# Patient Record
Sex: Male | Born: 1980
Health system: Southern US, Community
[De-identification: ages and names within clinical notes are randomized; demographics above are authoritative.]

## PROBLEM LIST (undated history)

## (undated) DIAGNOSIS — T7840XA Allergy, unspecified, initial encounter: Secondary | ICD-10-CM

## (undated) DIAGNOSIS — J45909 Unspecified asthma, uncomplicated: Secondary | ICD-10-CM

## (undated) HISTORY — DX: Unspecified asthma, uncomplicated: J45.909

## (undated) HISTORY — DX: Allergy, unspecified, initial encounter: T78.40XA

---

## 2004-04-01 ENCOUNTER — Emergency Department (HOSPITAL_COMMUNITY): Admission: EM | Admit: 2004-04-01 | Discharge: 2004-04-01 | Payer: Self-pay | Admitting: Emergency Medicine

## 2005-05-28 ENCOUNTER — Emergency Department (HOSPITAL_COMMUNITY): Admission: EM | Admit: 2005-05-28 | Discharge: 2005-05-28 | Payer: Self-pay | Admitting: Emergency Medicine

## 2005-06-02 ENCOUNTER — Emergency Department (HOSPITAL_COMMUNITY): Admission: EM | Admit: 2005-06-02 | Discharge: 2005-06-02 | Payer: Self-pay | Admitting: Emergency Medicine

## 2007-01-09 ENCOUNTER — Encounter: Admission: EM | Admit: 2007-01-09 | Discharge: 2007-01-09 | Payer: Self-pay | Admitting: Occupational Medicine

## 2008-02-13 ENCOUNTER — Emergency Department (HOSPITAL_COMMUNITY): Admission: EM | Admit: 2008-02-13 | Discharge: 2008-02-13 | Payer: Self-pay | Admitting: Family Medicine

## 2008-04-17 ENCOUNTER — Emergency Department (HOSPITAL_COMMUNITY): Admission: EM | Admit: 2008-04-17 | Discharge: 2008-04-17 | Payer: Self-pay | Admitting: Emergency Medicine

## 2008-11-27 ENCOUNTER — Emergency Department (HOSPITAL_COMMUNITY): Admission: EM | Admit: 2008-11-27 | Discharge: 2008-11-27 | Payer: Self-pay | Admitting: Obstetrics & Gynecology

## 2009-07-21 ENCOUNTER — Emergency Department (HOSPITAL_COMMUNITY): Admission: EM | Admit: 2009-07-21 | Discharge: 2009-07-21 | Payer: Self-pay | Admitting: Emergency Medicine

## 2009-08-08 ENCOUNTER — Emergency Department (HOSPITAL_COMMUNITY): Admission: EM | Admit: 2009-08-08 | Discharge: 2009-08-08 | Payer: Self-pay | Admitting: Emergency Medicine

## 2010-07-08 ENCOUNTER — Emergency Department (HOSPITAL_COMMUNITY): Admission: EM | Admit: 2010-07-08 | Discharge: 2010-07-08 | Payer: Self-pay | Admitting: Family Medicine

## 2011-02-08 ENCOUNTER — Inpatient Hospital Stay (INDEPENDENT_AMBULATORY_CARE_PROVIDER_SITE_OTHER)
Admission: RE | Admit: 2011-02-08 | Discharge: 2011-02-08 | Disposition: A | Discharge status: Home / Self Care | Payer: 59 | Source: Ambulatory Visit | Attending: Family Medicine | Admitting: Family Medicine

## 2011-02-08 DIAGNOSIS — H109 Unspecified conjunctivitis: Secondary | ICD-10-CM

## 2011-02-16 LAB — URINALYSIS, ROUTINE W REFLEX MICROSCOPIC
Ketones, ur: NEGATIVE mg/dL
Nitrite: NEGATIVE
Protein, ur: 30 mg/dL — AB
Specific Gravity, Urine: 1.025 (ref 1.005–1.030)
Urobilinogen, UA: 1 mg/dL (ref 0.0–1.0)

## 2011-02-16 LAB — URINE CULTURE: Culture: NO GROWTH

## 2011-02-16 LAB — URINE MICROSCOPIC-ADD ON

## 2015-01-11 ENCOUNTER — Emergency Department (HOSPITAL_COMMUNITY)
Admission: EM | Admit: 2015-01-11 | Discharge: 2015-01-11 | Disposition: A | Discharge status: Home / Self Care | Payer: 59 | Attending: Emergency Medicine | Admitting: Emergency Medicine

## 2015-01-11 ENCOUNTER — Encounter (HOSPITAL_COMMUNITY): Payer: Self-pay | Admitting: Emergency Medicine

## 2015-01-11 DIAGNOSIS — Y9289 Other specified places as the place of occurrence of the external cause: Secondary | ICD-10-CM | POA: Diagnosis not present

## 2015-01-11 DIAGNOSIS — Y9389 Activity, other specified: Secondary | ICD-10-CM | POA: Insufficient documentation

## 2015-01-11 DIAGNOSIS — S29019A Strain of muscle and tendon of unspecified wall of thorax, initial encounter: Secondary | ICD-10-CM | POA: Insufficient documentation

## 2015-01-11 DIAGNOSIS — Y998 Other external cause status: Secondary | ICD-10-CM | POA: Diagnosis not present

## 2015-01-11 DIAGNOSIS — W228XXA Striking against or struck by other objects, initial encounter: Secondary | ICD-10-CM | POA: Diagnosis not present

## 2015-01-11 DIAGNOSIS — S3992XA Unspecified injury of lower back, initial encounter: Secondary | ICD-10-CM | POA: Diagnosis present

## 2015-01-11 LAB — URINALYSIS, ROUTINE W REFLEX MICROSCOPIC
Bilirubin Urine: NEGATIVE
GLUCOSE, UA: NEGATIVE mg/dL
HGB URINE DIPSTICK: NEGATIVE
Ketones, ur: NEGATIVE mg/dL
LEUKOCYTES UA: NEGATIVE
Nitrite: NEGATIVE
PH: 7 (ref 5.0–8.0)
Protein, ur: NEGATIVE mg/dL
SPECIFIC GRAVITY, URINE: 1.015 (ref 1.005–1.030)
Urobilinogen, UA: 1 mg/dL (ref 0.0–1.0)

## 2015-01-11 MED ORDER — IBUPROFEN 800 MG PO TABS: 800.0000 mg | ORAL_TABLET | Freq: Three times a day (TID) | ORAL | Status: DC | PRN

## 2015-01-11 MED ORDER — HYDROCODONE-ACETAMINOPHEN 5-325 MG PO TABS: 1.0000 | ORAL_TABLET | ORAL | Status: DC | PRN

## 2015-01-11 MED ORDER — CYCLOBENZAPRINE HCL 5 MG PO TABS: 5.0000 mg | ORAL_TABLET | Freq: Three times a day (TID) | ORAL | Status: DC | PRN

## 2015-01-11 MED ORDER — IBUPROFEN 800 MG PO TABS
800.0000 mg | ORAL_TABLET | Freq: Once | ORAL | Status: AC
  Administered 2015-01-11: 800 mg via ORAL
  Filled 2015-01-11: qty 1

## 2015-01-11 NOTE — Discharge Instructions (Signed)
Muscle Strain °A muscle strain is an injury that occurs when a muscle is stretched beyond its normal length. Usually a small number of muscle fibers are torn when this happens. Muscle strain is rated in degrees. First-degree strains have the least amount of muscle fiber tearing and pain. Second-degree and third-degree strains have increasingly more tearing and pain.  °Usually, recovery from muscle strain takes 1-2 weeks. Complete healing takes 5-6 weeks.  °CAUSES  °Muscle strain happens when a sudden, violent force placed on a muscle stretches it too far. This may occur with lifting, sports, or a fall.  °RISK FACTORS °Muscle strain is especially common in athletes.  °SIGNS AND SYMPTOMS °At the site of the muscle strain, there may be: °· Pain. °· Bruising. °· Swelling. °· Difficulty using the muscle due to pain or lack of normal function. °DIAGNOSIS  °Your health care provider will perform a physical exam and ask about your medical history. °TREATMENT  °Often, the best treatment for a muscle strain is resting, icing, and applying cold compresses to the injured area.   °HOME CARE INSTRUCTIONS  °· Use the PRICE method of treatment to promote muscle healing during the first 2-3 days after your injury. The PRICE method involves: °· Protecting the muscle from being injured again. °· Restricting your activity and resting the injured body part. °· Icing your injury. To do this, put ice in a plastic bag. Place a towel between your skin and the bag. Then, apply the ice and leave it on from 15-20 minutes each hour. After the third day, switch to moist heat packs. °· Apply compression to the injured area with a splint or elastic bandage. Be careful not to wrap it too tightly. This may interfere with blood circulation or increase swelling. °· Elevate the injured body part above the level of your heart as often as you can. °· Only take over-the-counter or prescription medicines for pain, discomfort, or fever as directed by your  health care provider. °· Warming up prior to exercise helps to prevent future muscle strains. °SEEK MEDICAL CARE IF:  °· You have increasing pain or swelling in the injured area. °· You have numbness, tingling, or a significant loss of strength in the injured area. °MAKE SURE YOU:  °· Understand these instructions. °· Will watch your condition. °· Will get help right away if you are not doing well or get worse. °Document Released: 10/29/2005 Document Revised: 08/19/2013 Document Reviewed: 05/28/2013 °ExitCare® Patient Information ©2015 [employer]. This information is not intended to replace advice given to you by your health care provider. Make sure you discuss any questions you have with your health care provider. ° ° °Heat Therapy °Heat therapy can help ease sore, stiff, injured, and tight muscles and joints. Heat relaxes your muscles, which may help ease your pain.  °RISKS AND COMPLICATIONS °If you have any of the following conditions, do not use heat therapy unless your health care provider has approved: °· Poor circulation. °· Healing wounds or scarred skin in the area being treated. °· Diabetes, heart disease, or high blood pressure. °· Not being able to feel (numbness) the area being treated. °· Unusual swelling of the area being treated. °· Active infections. °· Blood clots. °· Cancer. °· Inability to communicate pain. This may include young children and people who have problems with their brain function (dementia). °· Pregnancy. °Heat therapy should only be used on old, pre-existing, or long-lasting (chronic) injuries. Do not use heat therapy on new injuries unless directed by   your health care provider. °HOW TO USE HEAT THERAPY °There are several different kinds of heat therapy, including: °· Moist heat pack. °· Warm water bath. °· Hot water bottle. °· Electric heating pad. °· Heated gel pack. °· Heated wrap. °· Electric heating pad. °Use the heat therapy method suggested by your health care provider.  Follow your health care provider's instructions on when and how to use heat therapy. °GENERAL HEAT THERAPY RECOMMENDATIONS °· Do not sleep while using heat therapy. Only use heat therapy while you are awake. °· Your skin may turn pink while using heat therapy. Do not use heat therapy if your skin turns red. °· Do not use heat therapy if you have new pain. °· High heat or long exposure to heat can cause burns. Be careful when using heat therapy to avoid burning your skin. °· Do not use heat therapy on areas of your skin that are already irritated, such as with a rash or sunburn. °SEEK MEDICAL CARE IF: °· You have blisters, redness, swelling, or numbness. °· You have new pain. °· Your pain is worse. °MAKE SURE YOU: °· Understand these instructions. °· Will watch your condition. °· Will get help right away if you are not doing well or get worse. °Document Released: 01/21/2012 Document Revised: 03/15/2014 Document Reviewed: 12/22/2013 °ExitCare® Patient Information ©2015 [employer]. This information is not intended to replace advice given to you by your health care provider. Make sure you discuss any questions you have with your health care provider. ° °

## 2015-01-11 NOTE — ED Provider Notes (Signed)
TIME SEEN: 7:10 AM  CHIEF COMPLAINT: Left-sided back pain  HPI: Patient is a 34 year old male who presents to the emergency department left-sided back pain that started at 6:30 PM last night. He states his job is rather physical and he has to frequently lift things that are heavy but denies any known injury to the back. States pain is worse with movement. Denies any nausea, vomiting, diarrhea. No fevers or chills. No numbness, tingling or focal weakness. No bowel or bladder incontinence. No prior history of kidney stones. Denies dysuria or hematuria.  ROS: See HPI Constitutional: no fever  Eyes: no drainage  ENT: no runny nose   Cardiovascular:  no chest pain  Resp: no SOB  GI: no vomiting GU: no dysuria Integumentary: no rash  Allergy: no hives  Musculoskeletal: no leg swelling  Neurological: no slurred speech ROS otherwise negative  PAST MEDICAL HISTORY/PAST SURGICAL HISTORY:  History reviewed. No pertinent past medical history.  MEDICATIONS:  Prior to Admission medications   Not on File    ALLERGIES:  No Known Allergies  SOCIAL HISTORY:  History  Substance Use Topics  . Smoking status: Never Smoker   . Smokeless tobacco: Not on file  . Alcohol Use: No    FAMILY HISTORY: History reviewed. No pertinent family history.  EXAM: BP 145/94 mmHg  Pulse 80  Temp(Src) 98.2 F (36.8 C) (Oral)  Resp 18  SpO2 100% CONSTITUTIONAL: Alert and oriented and responds appropriately to questions. Well-appearing; well-nourished HEAD: Normocephalic EYES: Conjunctivae clear, PERRL ENT: normal nose; no rhinorrhea; moist mucous membranes; pharynx without lesions noted NECK: Supple, no meningismus, no LAD  CARD: RRR; S1 and S2 appreciated; no murmurs, no clicks, no rubs, no gallops RESP: Normal chest excursion without splinting or tachypnea; breath sounds clear and equal bilaterally; no wheezes, no rhonchi, no rales ABD/GI: Normal bowel sounds; non-distended; soft, non-tender, no  rebound, no guarding BACK:  The back appears normal and is tender to palpation over the left-sided thoracic paraspinal muscles without obvious deformity or lesions, no CVA tenderness EXT: Normal ROM in all joints; non-tender to palpation; no edema; normal capillary refill; no cyanosis    SKIN: Normal color for age and race; warm NEURO: Moves all extremities equally, strength 5/5 in all 4 extremities, sensation to light touch intact diffusely, 2+ deep tendon reflexes in bilateral upper lower extremities, normal gait PSYCH: The patient's mood and manner are appropriate. Grooming and personal hygiene are appropriate.  MEDICAL DECISION MAKING: Patient here with what appears to be musculoskeletal back pain. Urine shows no sign of infection or hematuria to suggest urinary tract infection or kidney stone. Pain is reproducible with palpation and movement. He is neurologically intact. Have given him ibuprofen in the emergency department and will discharge with ibuprofen, Flexeril, Vicodin. Will provide work note. Discussed importance of stretching, applying heat and rest. Discussed return precautions. He verbalizes understanding and is comfortable with plan.       Layla MawKristen N Ward, DO 01/11/15 [phone]

## 2015-01-11 NOTE — ED Notes (Signed)
Pt is c/o left flank pain that started last night around 1830  Denies N/V/D  States pain feels like something is hitting him in the side and that the pain is worse with movement

## 2016-03-18 ENCOUNTER — Emergency Department (HOSPITAL_COMMUNITY)
Admission: EM | Admit: 2016-03-18 | Discharge: 2016-03-19 | Disposition: A | Discharge status: Home / Self Care | Payer: 59 | Attending: Emergency Medicine | Admitting: Emergency Medicine

## 2016-03-18 ENCOUNTER — Encounter (HOSPITAL_COMMUNITY): Payer: Self-pay | Admitting: Emergency Medicine

## 2016-03-18 ENCOUNTER — Emergency Department (HOSPITAL_COMMUNITY): Payer: 59

## 2016-03-18 DIAGNOSIS — W228XXA Striking against or struck by other objects, initial encounter: Secondary | ICD-10-CM | POA: Insufficient documentation

## 2016-03-18 DIAGNOSIS — S60221A Contusion of right hand, initial encounter: Secondary | ICD-10-CM | POA: Diagnosis not present

## 2016-03-18 DIAGNOSIS — Y9389 Activity, other specified: Secondary | ICD-10-CM | POA: Insufficient documentation

## 2016-03-18 DIAGNOSIS — Y998 Other external cause status: Secondary | ICD-10-CM | POA: Insufficient documentation

## 2016-03-18 DIAGNOSIS — Y9289 Other specified places as the place of occurrence of the external cause: Secondary | ICD-10-CM | POA: Insufficient documentation

## 2016-03-18 MED ORDER — OXYCODONE-ACETAMINOPHEN 5-325 MG PO TABS
1.0000 | ORAL_TABLET | Freq: Once | ORAL | Status: AC
  Administered 2016-03-18: 1 via ORAL
  Filled 2016-03-18: qty 1

## 2016-03-18 MED ORDER — IBUPROFEN 200 MG PO TABS
600.0000 mg | ORAL_TABLET | Freq: Once | ORAL | Status: AC
  Administered 2016-03-18: 600 mg via ORAL
  Filled 2016-03-18: qty 3

## 2016-03-18 NOTE — ED Notes (Signed)
Pt c/o R hand pain, swelling noted. Pt reports punching dash 1 hour ago.

## 2016-03-18 NOTE — Discharge Instructions (Signed)
Hand Contusion  A hand contusion is a deep bruise on your hand area. Contusions are the result of an injury that caused bleeding under the skin. The contusion may turn blue, purple, or yellow. Minor injuries will give you a painless contusion, but more severe contusions may stay painful and swollen for a few weeks.  CAUSES   A contusion is usually caused by a blow, trauma, or direct force to an area of the body.  SYMPTOMS    Swelling and redness of the injured area.   Discoloration of the injured area.   Tenderness and soreness of the injured area.   Pain.  DIAGNOSIS   The diagnosis can be made by taking a history and performing a physical exam. An X-ray, CT scan, or MRI may be needed to determine if there were any associated injuries, such as broken bones (fractures).  TREATMENT   Often, the best treatment for a hand contusion is resting, elevating, icing, and applying cold compresses to the injured area. Over-the-counter medicines may also be recommended for pain control.  HOME CARE INSTRUCTIONS    Put ice on the injured area.    Put ice in a plastic bag.    Place a towel between your skin and the bag.    Leave the ice on for 15-20 minutes, 03-04 times a day.   Only take over-the-counter or prescription medicines as directed by your caregiver. Your caregiver may recommend avoiding anti-inflammatory medicines (aspirin, ibuprofen, and naproxen) for 48 hours because these medicines may increase bruising.   If told, use an elastic wrap as directed. This can help reduce swelling. You may remove the wrap for sleeping, showering, and bathing. If your fingers become numb, cold, or blue, take the wrap off and reapply it more loosely.   Elevate your hand with pillows to reduce swelling.   Avoid overusing your hand if it is painful.  SEEK IMMEDIATE MEDICAL CARE IF:    You have increased redness, swelling, or pain in your hand.   Your swelling or pain is not relieved with medicines.   You have loss of feeling in  your hand or are unable to move your fingers.   Your hand turns cold or blue.   You have pain when you move your fingers.   Your hand becomes warm to the touch.   Your contusion does not improve in 2 days.  MAKE SURE YOU:    Understand these instructions.   Will watch your condition.   Will get help right away if you are not doing well or get worse.     This information is not intended to replace advice given to you by your health care provider. Make sure you discuss any questions you have with your health care provider.     Document Released: 04/20/2002 Document Revised: 07/23/2012 Document Reviewed: 04/21/2012  Elsevier Interactive Patient Education 2016 [employer]

## 2016-03-25 ENCOUNTER — Encounter (HOSPITAL_COMMUNITY): Payer: Self-pay | Admitting: Emergency Medicine

## 2016-03-25 ENCOUNTER — Emergency Department (HOSPITAL_COMMUNITY)
Admission: EM | Admit: 2016-03-25 | Discharge: 2016-03-26 | Disposition: A | Discharge status: Home / Self Care | Payer: 59 | Attending: Emergency Medicine | Admitting: Emergency Medicine

## 2016-03-25 DIAGNOSIS — Y9389 Activity, other specified: Secondary | ICD-10-CM | POA: Insufficient documentation

## 2016-03-25 DIAGNOSIS — S6991XA Unspecified injury of right wrist, hand and finger(s), initial encounter: Secondary | ICD-10-CM | POA: Insufficient documentation

## 2016-03-25 DIAGNOSIS — W228XXA Striking against or struck by other objects, initial encounter: Secondary | ICD-10-CM | POA: Insufficient documentation

## 2016-03-25 DIAGNOSIS — Y998 Other external cause status: Secondary | ICD-10-CM | POA: Insufficient documentation

## 2016-03-25 DIAGNOSIS — Y9289 Other specified places as the place of occurrence of the external cause: Secondary | ICD-10-CM | POA: Insufficient documentation

## 2016-03-25 NOTE — ED Provider Notes (Signed)
CSN: [id number]     Arrival date & time 03/18/16  2143 History   First MD Initiated Contact with Patient 03/18/16 2226     Chief Complaint  Patient presents with  . Hand Pain     (Consider location/radiation/quality/duration/timing/severity/associated sxs/prior Treatment) HPI   35 year old male with right hand pain. Approximately 1 hour prior to arrival patient was upset and punched a dashboard of a car. Immediate onset of pain and swelling. Persistent since then. No numbness or tingling. Denies significant pain anywhere else. No intervention prior to arrival.     History reviewed. No pertinent past medical history. History reviewed. No pertinent past surgical history. No family history on file. Social History  Substance Use Topics  . Smoking status: Never Smoker   . Smokeless tobacco: None  . Alcohol Use: No    Review of Systems  All systems reviewed and negative, other than as noted in HPI.   Allergies  Review of patient's allergies indicates no known allergies.  Home Medications   Prior to Admission medications   Not on File   BP 124/88 mmHg  Pulse 66  Temp(Src) 98.1 F (36.7 C) (Oral)  Resp 13  SpO2 95% Physical Exam  Constitutional: He is oriented to person, place, and time. He appears well-developed and well-nourished. No distress.  HENT:  Head: Normocephalic and atraumatic.  Eyes: Conjunctivae are normal. Right eye exhibits no discharge. Left eye exhibits no discharge.  Neck: Neck supple.  Cardiovascular: Normal rate, regular rhythm and normal heart sounds.  Exam reveals no gallop and no friction rub.   No murmur heard. Pulmonary/Chest: Effort normal and breath sounds normal. No respiratory distress.  Abdominal: Soft. He exhibits no distension. There is no tenderness.  Musculoskeletal: He exhibits edema and tenderness.  Swelling and tenderness over the dorsal/distal aspect of the right hand primarily near the third MP joint. Can fully range against  resistance although with increased pain. Closed injury. Neurovascular intact distally. No tenderness of the proximal hand or wrist.  Neurological: He is alert and oriented to person, place, and time. No cranial nerve deficit. He exhibits normal muscle tone. Coordination normal.  Skin: Skin is warm and dry.  Psychiatric: He has a normal mood and affect. His behavior is normal. Thought content normal.  Nursing note and vitals reviewed.   ED Course  Procedures (including critical care time) Labs Review Labs Reviewed - No data to display  Imaging Review No results found. I have personally reviewed and evaluated these images and lab results as part of my medical decision-making.   EKG Interpretation None      MDM   Final diagnoses:  Hand contusion, right, initial encounter    35 year old male with right pain after punching a dashboard. Some soft tissue swelling. Closed injury. Neurovascularly intact. Imaging negative for acute osseous abnormality.    Raeford RazorStephen Jaidan Prevette, MD 03/25/16 1020

## 2016-03-25 NOTE — ED Notes (Signed)
Pt from home with complaints of right hand swelling from punching a dashboard 5/7. Pt denies pain. Pt was seen for this, xrays were done and pt was told nothing was broken. Pt has an area that looks like a scar on his right middle finger that he states is the cause of his concern. Pt has full sensation and movement in this hand.

## 2016-03-26 NOTE — ED Notes (Signed)
Patient called and no answer for the third time.  

## 2016-03-26 NOTE — ED Notes (Signed)
Patient was called and no answer.

## 2016-03-26 NOTE — ED Notes (Signed)
Patient called and no answer. 

## 2016-07-15 DIAGNOSIS — T63441A Toxic effect of venom of bees, accidental (unintentional), initial encounter: Secondary | ICD-10-CM | POA: Insufficient documentation

## 2016-07-16 ENCOUNTER — Emergency Department (HOSPITAL_COMMUNITY)
Admission: EM | Admit: 2016-07-16 | Discharge: 2016-07-16 | Disposition: A | Discharge status: Home / Self Care | Payer: Commercial Managed Care - HMO | Attending: Emergency Medicine | Admitting: Emergency Medicine

## 2016-07-16 ENCOUNTER — Encounter (HOSPITAL_COMMUNITY): Payer: Self-pay

## 2016-07-16 DIAGNOSIS — T63441A Toxic effect of venom of bees, accidental (unintentional), initial encounter: Secondary | ICD-10-CM

## 2016-07-16 NOTE — Discharge Instructions (Signed)
Please read and follow all provided instructions.  Your diagnoses today include:  1. Bee sting, accidental or unintentional, initial encounter     Tests performed today include: Vital signs. See below for your results today.   Medications prescribed:  Take as prescribed   Home care instructions:  Follow any educational materials contained in this packet. Use cold compresses over the area.  Follow-up instructions: Please follow-up with your primary care provider for further evaluation of symptoms and treatment   Return instructions:  Please return to the Emergency Department if you do not get better, if you get worse, or new symptoms OR  - Fever (temperature greater than 101.68F)  - Bleeding that does not stop with holding pressure to the area    -Severe pain (please note that you may be more sore the day after your accident)  - Chest Pain  - Difficulty breathing  - Severe nausea or vomiting  - Inability to tolerate food and liquids  - Passing out  - Skin becoming red around your wounds  - Change in mental status (confusion or lethargy)  - New numbness or weakness    Please return if you have any other emergent concerns.  Additional Information:  Your vital signs today were: BP 143/94 (BP Location: Left Arm)    Pulse 74    Temp 98.7 F (37.1 C) (Oral)    Resp 16    SpO2 97%  If your blood pressure (BP) was elevated above 135/85 this visit, please have this repeated by your doctor within one month. ---------------

## 2016-07-16 NOTE — ED Triage Notes (Signed)
Patient c/o left eye pain after being stung by a japanese hornet x 20 minutes ago.  Patient states that eye is swollen and burning. Denies being allergic to anything, denies difficulty breathing, denies any vision problems. Patient rates pain 5/10

## 2016-07-16 NOTE — ED Notes (Signed)
Pt reports pain below right eye that started appx ago after after being stung by hornet. Denies any blurred vision at this time. No treatment prior to arrival.

## 2016-07-16 NOTE — ED Provider Notes (Signed)
[facility]-EMERGENCY DEPT Provider Note   CSN: [id number] Arrival date & time: 07/15/16  2353  By signing my name below, I, Christel MormonMatthew Jamison, attest that this documentation has been prepared under the direction and in the presence of Audry Piliyler Leaf Kernodle, PA-C.  Electronically Signed: Christel MormonMatthew Jamison, Scribe. 07/16/2016. 12:15 AM.    History   Chief Complaint Chief Complaint  Patient presents with  . Insect Bite   The history is provided by the patient. No language interpreter was used.   HPI Comments:  Aaron Williamson is a 35 y.o. male who presents to the Emergency Department complaining of a sudden onset bee sting x30 minutes. Pt reports that he was at home "fighting with bees" in his house and then a bee stung him near his R eye. Pt describes the pain as 5/10. Pt denies visual disturbance, nausea, vomiting, or difficulty breathing.   History reviewed. No pertinent past medical history.  There are no active problems to display for this patient.   History reviewed. No pertinent surgical history.  Home Medications    Prior to Admission medications   Medication Sig Start Date End Date Taking? Authorizing Provider  ibuprofen (ADVIL,MOTRIN) 200 MG tablet Take 200-400 mg by mouth every 6 (six) hours as needed for moderate pain.    Historical Provider, MD   Family History No family history on file.  Social History Social History  Substance Use Topics  . Smoking status: Never Smoker  . Smokeless tobacco: Never Used  . Alcohol use Yes     Comment: socially   Allergies   Fish allergy  Review of Systems Review of Systems 10 Systems reviewed and are negative for acute change except as noted in the HPI.  Physical Exam Updated Vital Signs BP 143/94 (BP Location: Left Arm)   Pulse 74   Temp 98.7 F (37.1 C) (Oral)   Resp 16   SpO2 97%   Physical Exam  Constitutional: He appears well-developed and well-nourished. No distress.  Phonating well, breathing normally.   HENT:  Head:  Normocephalic and atraumatic.  Eyes: Conjunctivae, EOM and lids are normal. Pupils are equal, round, and reactive to light. Lids are everted and swept, no foreign bodies found.  Neck: Normal range of motion. Neck supple.  Cardiovascular: Normal rate, regular rhythm, normal heart sounds and intact distal pulses.   Pulmonary/Chest: Effort normal and breath sounds normal.  Abdominal: He exhibits no distension.  Neurological: He is alert.  Skin: Skin is warm and dry.  Localized inflammation under R eye lid.  No visible erythema or stinger located.  Psychiatric: He has a normal mood and affect.  Nursing note and vitals reviewed.  ED Treatments / Results  DIAGNOSTIC STUDIES:  Oxygen Saturation is 97% on RA, normal by my interpretation.    COORDINATION OF CARE:  12:14 AM Discussed treatment plan with pt at bedside and pt agreed to plan.  Labs (all labs ordered are listed, but only abnormal results are displayed) Labs Reviewed - No data to display  EKG  EKG Interpretation None       Radiology No results found.  Procedures Procedures (including critical care time)  Medications Ordered in ED Medications - No data to display   Initial Impression / Assessment and Plan / ED Course  I have reviewed the triage vital signs and the nursing notes.  Pertinent labs & imaging results that were available during my care of the patient were reviewed by me and considered in my medical decision making (see chart  for details).  Clinical Course    Final Clinical Impressions(s) / ED Diagnoses  I have reviewed the relevant previous healthcare records. I obtained HPI from historian.  ED Course:  Assessment: Pt is a 34yM who presents with localized inflammation from hornet sting x 30 min ago. No breathing difficulty. NO erythema. Mild swelling lower eyelid. No visual changes. On exam, pt in NAD. Nontoxic/nonseptic appearing. VSS. Afebrile. Lungs CTA. Heart RRR. Phonating well. No visual  disturbances. Likely localized inflammation from sting. Counseled on cold compresses. Plan is to DC Home. At time of discharge, Patient is in no acute distress. Vital Signs are stable. Patient is able to ambulate. Patient able to tolerate PO.    Disposition/Plan:  DC Home Additional Verbal discharge instructions given and discussed with patient.  Pt Instructed to f/u with PCP in the next week for evaluation and treatment of symptoms. Return precautions given Pt acknowledges and agrees with plan  Supervising Physician April Palumbo, MD   Final diagnoses:  Bee sting, accidental or unintentional, initial encounter    New Prescriptions New Prescriptions   No medications on file    I personally performed the services described in this documentation, which was scribed in my presence. The recorded information has been reviewed and is accurate.      Audry Pili, PA-C 07/16/16 0026    Cy Blamer, MD 07/16/16 [phone]

## 2016-11-19 ENCOUNTER — Ambulatory Visit (INDEPENDENT_AMBULATORY_CARE_PROVIDER_SITE_OTHER): Payer: Managed Care, Other (non HMO) | Admitting: Family Medicine

## 2016-11-19 ENCOUNTER — Encounter: Payer: Self-pay | Admitting: General Practice

## 2016-11-19 ENCOUNTER — Encounter: Payer: Self-pay | Admitting: Family Medicine

## 2016-11-19 DIAGNOSIS — L989 Disorder of the skin and subcutaneous tissue, unspecified: Secondary | ICD-10-CM | POA: Diagnosis not present

## 2016-11-19 DIAGNOSIS — R631 Polydipsia: Secondary | ICD-10-CM | POA: Insufficient documentation

## 2016-11-19 DIAGNOSIS — M722 Plantar fascial fibromatosis: Secondary | ICD-10-CM | POA: Diagnosis not present

## 2016-11-19 DIAGNOSIS — R635 Abnormal weight gain: Secondary | ICD-10-CM | POA: Insufficient documentation

## 2016-11-19 LAB — CBC WITH DIFFERENTIAL/PLATELET
BASOS ABS: 0 10*3/uL (ref 0.0–0.1)
Basophils Relative: 0.5 % (ref 0.0–3.0)
EOS ABS: 0.2 10*3/uL (ref 0.0–0.7)
Eosinophils Relative: 2 % (ref 0.0–5.0)
HCT: 46.2 % (ref 39.0–52.0)
Hemoglobin: 15.8 g/dL (ref 13.0–17.0)
LYMPHS ABS: 2.2 10*3/uL (ref 0.7–4.0)
Lymphocytes Relative: 25.9 % (ref 12.0–46.0)
MCHC: 34.2 g/dL (ref 30.0–36.0)
MCV: 87.9 fl (ref 78.0–100.0)
MONOS PCT: 8.9 % (ref 3.0–12.0)
Monocytes Absolute: 0.7 10*3/uL (ref 0.1–1.0)
NEUTROS ABS: 5.2 10*3/uL (ref 1.4–7.7)
NEUTROS PCT: 62.7 % (ref 43.0–77.0)
PLATELETS: 223 10*3/uL (ref 150.0–400.0)
RBC: 5.26 Mil/uL (ref 4.22–5.81)
RDW: 13.1 % (ref 11.5–15.5)
WBC: 8.3 10*3/uL (ref 4.0–10.5)

## 2016-11-19 LAB — BASIC METABOLIC PANEL
BUN: 14 mg/dL (ref 6–23)
CHLORIDE: 101 meq/L (ref 96–112)
CO2: 31 meq/L (ref 19–32)
Calcium: 10.3 mg/dL (ref 8.4–10.5)
Creatinine, Ser: 1 mg/dL (ref 0.40–1.50)
GFR: 109.94 mL/min (ref >60.00)
GLUCOSE: 115 mg/dL — AB (ref 70–99)
POTASSIUM: 4.8 meq/L (ref 3.5–5.1)
SODIUM: 139 meq/L (ref 135–145)

## 2016-11-19 LAB — HEPATIC FUNCTION PANEL
ALK PHOS: 67 U/L (ref 39–117)
ALT: 24 U/L (ref 0–53)
AST: 20 U/L (ref 0–37)
Albumin: 4.7 g/dL (ref 3.5–5.2)
BILIRUBIN DIRECT: 0.1 mg/dL (ref 0.0–0.3)
TOTAL PROTEIN: 8.4 g/dL — AB (ref 6.0–8.3)
Total Bilirubin: 0.5 mg/dL (ref 0.2–1.2)

## 2016-11-19 LAB — LIPID PANEL
CHOLESTEROL: 162 mg/dL (ref 0–200)
HDL: 56.7 mg/dL (ref >39.00)
LDL Cholesterol: 88 mg/dL (ref 0–99)
NonHDL: 105.23
Total CHOL/HDL Ratio: 3
Triglycerides: 86 mg/dL (ref 0.0–149.0)
VLDL: 17.2 mg/dL (ref 0.0–40.0)

## 2016-11-19 LAB — TSH: TSH: 0.99 u[IU]/mL (ref 0.35–4.50)

## 2016-11-19 LAB — HEMOGLOBIN A1C: HEMOGLOBIN A1C: 5.6 % (ref 4.6–6.5)

## 2016-11-19 MED ORDER — NAPROXEN 500 MG PO TABS
500.0000 mg | ORAL_TABLET | Freq: Two times a day (BID) | ORAL | 0 refills | Status: DC
Start: 2016-11-19 — End: 2017-02-22

## 2016-11-19 NOTE — Progress Notes (Signed)
   Subjective:    Patient ID: Aaron Williamson, male    DOB: 03/14/1981, 36 y.o.   MRN: [id number]  HPI New to establish.  No recent PCP.    Foot pain- R foot, located on bottom of heel- 'worse in the middle of the night and first thing in the AM'.  Pain improves w/ walking on it and ibuprofen.  sxs started in August.  Wearing steel toed boots.  Using heel inserts w/ some relief.  No pain unless bearing weight.  Increased thirst- [facility]  'super thirsty.  Can drink 4 bottles of water and I'm still thirsty'.  Also craving sugar.  No increased urination.  No change in appetite.  Pt reports gaining ~50 lbs in the last 3-4 months.  + fatigue recently.  + family hx of diabetes.  Skin lesion- pt reports it has been present for ~10 yrs.  Pt had a bx years ago and was told it was 'a regular wart'.  Pt will frequently pick at it.  No drainage or oozing.   Review of Systems For ROS see HPI     Objective:   Physical Exam  Constitutional: He is oriented to person, place, and time. He appears well-developed and well-nourished. No distress.  overweight  HENT:  Head: Normocephalic and atraumatic.  Eyes: Conjunctivae and EOM are normal. Pupils are equal, round, and reactive to light.  Neck: Normal range of motion. Neck supple. No thyromegaly present.  Cardiovascular: Normal rate, regular rhythm, normal heart sounds and intact distal pulses.   No murmur heard. Pulmonary/Chest: Effort normal and breath sounds normal. No respiratory distress.  Abdominal: Soft. Bowel sounds are normal. He exhibits no distension.  Musculoskeletal: He exhibits tenderness (TTP over insertion of R plantar fascia). He exhibits no edema.  Lymphadenopathy:    He has no cervical adenopathy.  Neurological: He is alert and oriented to person, place, and time. No cranial nerve deficit.  Skin: Skin is warm and dry.  Has 2 cm diameter slightly raised hypertrophic scar w/ overlying dry skin on R upper  forearm  Psychiatric: He has a normal mood and affect. His behavior is normal.  Vitals reviewed.         Assessment & Plan:

## 2016-11-19 NOTE — Assessment & Plan Note (Signed)
New.  Pt's sxs and PE consistent w/ inflammation and plantar fasciitis.  Start scheduled NSAIDs.  Ice.  Pt was instructed on appropriate stretching and footwear.  Pt expressed understanding and is in agreement w/ plan.

## 2016-11-19 NOTE — Assessment & Plan Note (Signed)
New.  Pt's sxs and family hx are concerning for diabetes- especially in setting of recent 50 lb weight gain.  Check labs to r/o diabetes and other metabolic causes.  Stressed need for low carb diet.  Will follow closely.

## 2016-11-19 NOTE — Patient Instructions (Signed)
Schedule your complete physical in 3-4 months We'll notify you of your lab results and make any changes if needed Try and limit your sugar intake (this includes carbs- pasta, bread, rice, potatoes, alcohol, sweets) Drink water and avoid sugary drinks Roll your foot over a frozen water bottle for the plantar fasciitis Start the Naproxen as directed- take w/ food- for 7-10 days and then as needed Wear cushioned shoe insoles to help w/ pain Apply hydrocortisone twice daily to the area on the arm to help smooth it out Call with any questions or concerns Welcome!  We're glad to have you!!

## 2016-11-19 NOTE — Assessment & Plan Note (Signed)
New.  Appears to be a hypertrophic scar w/ scabs from picking and dry skin.  Start hydrocortisone to smooth scar.  Pt expressed understanding and is in agreement w/ plan.

## 2016-11-19 NOTE — Progress Notes (Signed)
Pre visit review using our clinic review tool, if applicable. No additional management support is needed unless otherwise documented below in the visit note. 

## 2016-11-19 NOTE — Assessment & Plan Note (Signed)
New.  Pt reports he has gained 50 lbs in the last few months.  Stressed need to cut back on ETOH intake and carbs.  Discussed need for regular exercise.  Check labs to risk stratify.  Will follow.

## 2017-02-21 ENCOUNTER — Encounter (HOSPITAL_COMMUNITY): Payer: Self-pay | Admitting: *Deleted

## 2017-02-21 ENCOUNTER — Emergency Department (HOSPITAL_COMMUNITY)
Admission: EM | Admit: 2017-02-21 | Discharge: 2017-02-22 | Disposition: A | Discharge status: Home / Self Care | Payer: Managed Care, Other (non HMO) | Attending: Emergency Medicine | Admitting: Emergency Medicine

## 2017-02-21 DIAGNOSIS — N451 Epididymitis: Secondary | ICD-10-CM | POA: Insufficient documentation

## 2017-02-21 DIAGNOSIS — Z79899 Other long term (current) drug therapy: Secondary | ICD-10-CM | POA: Diagnosis not present

## 2017-02-21 DIAGNOSIS — N50812 Left testicular pain: Secondary | ICD-10-CM | POA: Diagnosis present

## 2017-02-21 DIAGNOSIS — N5089 Other specified disorders of the male genital organs: Secondary | ICD-10-CM

## 2017-02-21 LAB — URINALYSIS, ROUTINE W REFLEX MICROSCOPIC
Bacteria, UA: NONE SEEN
Bilirubin Urine: NEGATIVE
GLUCOSE, UA: NEGATIVE mg/dL
KETONES UR: 80 mg/dL — AB
NITRITE: NEGATIVE
PROTEIN: 30 mg/dL — AB
Specific Gravity, Urine: 1.028 (ref 1.005–1.030)
pH: 5 (ref 5.0–8.0)

## 2017-02-21 NOTE — ED Provider Notes (Signed)
[facility]-EMERGENCY DEPT Provider Note   CSN: [id number] Arrival date & time: 02/21/17  2142  By signing my name below, I, [name], attest that this documentation has been prepared under the direction and in the presence of [employer], PA-C. Electronically Signed: [name], Scribe. 02/21/2017. 11:38 PM.  History   Chief Complaint Chief Complaint  Patient presents with  . Groin Swelling    The history is provided by the patient. No language interpreter was used.     HPI Comments: Aaron Williamson is a 36 y.o. male who presents to the Emergency Department complaining of intermittent left testicle pain beginning 5 days ago. He reports left testicle swelling beginning 3 days ago and some pain in his left lower back for a couple of days. Patient states his right testicle is not in pain currently. No trauma to his testicles or back. Pt has tried ibuprofen and acetaminophen with no improvement of his pain. He is sexually active and has concern for STD exposure. He denies dysuria, penile discharge, fevers, diarrhea, abdominal pain, or any other associated symptoms.  Past Medical History:  Diagnosis Date  . Allergy     Patient Active Problem List   Diagnosis Date Noted  . Plantar fasciitis of right foot 11/19/2016  . Increased thirst 11/19/2016  . Skin lesion of right arm 11/19/2016  . Unexplained weight gain 11/19/2016    History reviewed. No pertinent surgical history.     Home Medications    Prior to Admission medications   Medication Sig Start Date End Date Taking? Authorizing Provider  doxycycline (VIBRAMYCIN) 100 MG capsule Take 1 capsule (100 mg total) by mouth 2 (two) times daily. 02/22/17   Renne Crigler, PA-C  ibuprofen (ADVIL,MOTRIN) 800 MG tablet Take 1 tablet (800 mg total) by mouth 3 (three) times daily. 02/22/17   Renne Crigler, PA-C    Family History Family History  Problem Relation Age of Onset  . Diabetes Mother   . Arthritis Mother   .  Hypertension Sister   . Diabetes Sister     Social History Social History  Substance Use Topics  . Smoking status: Never Smoker  . Smokeless tobacco: Never Used  . Alcohol use Yes     Comment: socially     Allergies   Fish allergy   Review of Systems Review of Systems  Constitutional: Negative for fever.  HENT: Negative for sore throat.   Eyes: Negative for discharge.  Gastrointestinal: Negative for abdominal pain, diarrhea and rectal pain.  Genitourinary: Positive for scrotal swelling (L) and testicular pain (L). Negative for discharge, dysuria, frequency, genital sores and penile pain.  Musculoskeletal: Positive for back pain. Negative for arthralgias.  Skin: Negative for rash.  Hematological: Negative for adenopathy.   Physical Exam Updated Vital Signs BP 138/84 (BP Location: Left Arm)   Pulse (!) 102   Temp 99.1 F (37.3 C) (Oral)   Resp 18   SpO2 98%   Physical Exam  Constitutional: He is oriented to person, place, and time. He appears well-developed and well-nourished. No distress.  HENT:  Head: Normocephalic and atraumatic.  Eyes: Conjunctivae and EOM are normal.  Neck: Normal range of motion. Neck supple. No tracheal deviation present.  Cardiovascular: Normal rate.   Pulmonary/Chest: Effort normal. No respiratory distress.  Abdominal: Hernia confirmed negative in the right inguinal area and confirmed negative in the left inguinal area.  Genitourinary: Right testis shows no swelling and no tenderness. Left testis shows swelling (firm L epididymitis, enlarged) and tenderness.  Circumcised. No discharge found.  Musculoskeletal: Normal range of motion.  Lymphadenopathy: No inguinal adenopathy noted on the right or left side.  Neurological: He is alert and oriented to person, place, and time.  Skin: Skin is warm and dry.  Psychiatric: He has a normal mood and affect. His behavior is normal.  Nursing note and vitals reviewed.  ED Treatments / Results   Labs (all labs ordered are listed, but only abnormal results are displayed) Labs Reviewed  URINALYSIS, ROUTINE W REFLEX MICROSCOPIC - Abnormal; Notable for the following:       Result Value   APPearance HAZY (*)    Hgb urine dipstick LARGE (*)    Ketones, ur 80 (*)    Protein, ur 30 (*)    Leukocytes, UA LARGE (*)    Squamous Epithelial / LPF 0-5 (*)    All other components within normal limits  RPR  HIV ANTIBODY (ROUTINE TESTING)  GC/CHLAMYDIA PROBE AMP ([place]) NOT AT [facility]    Radiology US Scrotum  Result Date: 02/22/2017 CLINICAL DATA:  Initial evaluation for intermittent left testicular pain and swelling for 3-5 days. EXAM: ULTRASOUND OF SCROTUM TECHNIQUE: Complete ultrasound examination of the testicles, epididymis, and other scrotal structures was performed. COMPARISON:  None. FINDINGS: Right testicle Measurements: 4.4 x 2.4 x 2.4 cm. No mass or microlithiasis visualized. Left testicle Measurements: 4.3 x 3.1 x 3.0 cm. No mass or microlithiasis visualized. Right epididymis:  Normal in size and appearance. Left epididymis: Left epididymis is asymmetrically enlarged, heterogeneous in appearance, with relatively increased vascularity. Finding suggestive of acute left epididymitis. Hydrocele:  Small left hydrocele, likely reactive. Varicocele:  None visualized. IMPRESSION: 1. Findings suggestive of acute left epididymitis as above. 2. Small left hydrocele, likely reactive. Electronically Signed   By: Rise Mu M.D.   On: 02/22/2017 01:21   [place] Art/ven Flow Abd Pelv Doppler  Result Date: 02/22/2017 CLINICAL DATA:  Initial evaluation for intermittent left testicular pain and swelling for 3-5 days. EXAM: ULTRASOUND OF SCROTUM TECHNIQUE: Complete ultrasound examination of the testicles, epididymis, and other scrotal structures was performed. COMPARISON:  None. FINDINGS: Right testicle Measurements: 4.4 x 2.4 x 2.4 cm. No mass or microlithiasis visualized. Left testicle  Measurements: 4.3 x 3.1 x 3.0 cm. No mass or microlithiasis visualized. Right epididymis:  Normal in size and appearance. Left epididymis: Left epididymis is asymmetrically enlarged, heterogeneous in appearance, with relatively increased vascularity. Finding suggestive of acute left epididymitis. Hydrocele:  Small left hydrocele, likely reactive. Varicocele:  None visualized. IMPRESSION: 1. Findings suggestive of acute left epididymitis as above. 2. Small left hydrocele, likely reactive. Electronically Signed   By: Rise Mu M.D.   On: 02/22/2017 01:21    Procedures Procedures (including critical care time)  DIAGNOSTIC STUDIES: Oxygen Saturation is 98% on RA, normal by my interpretation.    COORDINATION OF CARE: 11:44 PM Discussed treatment plan with pt at bedside and pt agreed to plan.  Medications Ordered in ED Medications - No data to display   Initial Impression / Assessment and Plan / ED Course  I have reviewed the triage vital signs and the nursing notes.  Pertinent labs & imaging results that were available during my care of the patient were reviewed by me and considered in my medical decision making (see chart for details).     Patient seen and examined. Ultrasound ordered.   Vital signs reviewed and are as follows: BP 126/85 (BP Location: Left Arm)   Pulse 98   Temp 99.1 F (  37.3 C) (Oral)   Resp 18   SpO2 100%   2:25 AM reviewed ultrasound report with patient. Rocephin and doxycycline ordered given prior to discharge. Patient to be discharged home on 10 additional days of doxycycline.  Counseled on supportive treatments for epididymitis.  GC/chlamydia, RPR, HIV pending. Patient counseled on safe sexual practices. Told them that they should not have sexual contact for next 7 days and that they need to inform sexual partners so that they can get tested and treated as well. Patient verbalizes understanding and agrees with plan.      Final Clinical  Impressions(s) / ED Diagnoses   Final diagnoses:  Swelling of left testicle  Epididymitis   Patient with left testicular swelling. Ultrasound demonstrates epididymitis; no torsion. No cellulitis. Patient tested and treated as above.   New Prescriptions New Prescriptions   DOXYCYCLINE (VIBRAMYCIN) 100 MG CAPSULE    Take 1 capsule (100 mg total) by mouth 2 (two) times daily.   IBUPROFEN (ADVIL,MOTRIN) 800 MG TABLET    Take 1 tablet (800 mg total) by mouth 3 (three) times daily.   I personally performed the services described in this documentation, which was scribed in my presence. The recorded information has been reviewed and is accurate.    Renne Crigler, PA-C 02/22/17 0227    Renne Crigler, PA-C 02/22/17 4098    Pricilla Loveless, MD 02/22/17 [phone]

## 2017-02-21 NOTE — ED Triage Notes (Signed)
Pt complains of left sided scrotal swelling and pain x 5 days. Pt states he noticed his urine is cloudy today. Pt has tried ice and heat with no relief.

## 2017-02-22 ENCOUNTER — Emergency Department (HOSPITAL_COMMUNITY): Payer: Managed Care, Other (non HMO)

## 2017-02-22 ENCOUNTER — Encounter (HOSPITAL_COMMUNITY): Payer: Self-pay | Admitting: Emergency Medicine

## 2017-02-22 ENCOUNTER — Emergency Department (HOSPITAL_COMMUNITY)
Admission: EM | Admit: 2017-02-22 | Discharge: 2017-02-22 | Disposition: A | Discharge status: Home / Self Care | Payer: Managed Care, Other (non HMO) | Source: Home / Self Care | Attending: Emergency Medicine | Admitting: Emergency Medicine

## 2017-02-22 DIAGNOSIS — N451 Epididymitis: Secondary | ICD-10-CM | POA: Insufficient documentation

## 2017-02-22 MED ORDER — HYDROCODONE-ACETAMINOPHEN 5-325 MG PO TABS: 1.0000 | ORAL_TABLET | ORAL | 0 refills | Status: DC | PRN

## 2017-02-22 MED ORDER — IBUPROFEN 800 MG PO TABS
800.0000 mg | ORAL_TABLET | Freq: Once | ORAL | Status: AC
  Administered 2017-02-22: 800 mg via ORAL
  Filled 2017-02-22: qty 1

## 2017-02-22 MED ORDER — LIDOCAINE HCL 1 % IJ SOLN
INTRAMUSCULAR | Status: AC
  Filled 2017-02-22: qty 20

## 2017-02-22 MED ORDER — DOXYCYCLINE HYCLATE 100 MG PO TABS
100.0000 mg | ORAL_TABLET | Freq: Once | ORAL | Status: AC
  Administered 2017-02-22: 100 mg via ORAL
  Filled 2017-02-22: qty 1

## 2017-02-22 MED ORDER — DOXYCYCLINE HYCLATE 100 MG PO CAPS: 100.0000 mg | ORAL_CAPSULE | Freq: Two times a day (BID) | ORAL | 0 refills | Status: DC

## 2017-02-22 MED ORDER — IBUPROFEN 800 MG PO TABS: 800.0000 mg | ORAL_TABLET | Freq: Three times a day (TID) | ORAL | 0 refills | Status: DC

## 2017-02-22 MED ORDER — CEFTRIAXONE SODIUM 250 MG IJ SOLR
250.0000 mg | Freq: Once | INTRAMUSCULAR | Status: AC
  Administered 2017-02-22: 250 mg via INTRAMUSCULAR
  Filled 2017-02-22: qty 250

## 2017-02-22 MED ORDER — FENTANYL CITRATE (PF) 100 MCG/2ML IJ SOLN
50.0000 ug | INTRAMUSCULAR | Status: DC | PRN
  Administered 2017-02-22: 50 ug via NASAL
  Filled 2017-02-22: qty 2

## 2017-02-22 NOTE — Discharge Instructions (Signed)
Need to fill the prescriptions that were prescribed last night. Your symptoms will not improve without these antibiotics. Follow-up with urology. Continue having issues.

## 2017-02-22 NOTE — ED Provider Notes (Signed)
[facility]-EMERGENCY DEPT Provider Note   CSN: [id number] Arrival date & time: 02/22/17  0906     History   Chief Complaint Chief Complaint  Patient presents with  . Abdominal Pain    HPI Aaron Williamson is a 36 y.o. male.  The history is provided by the patient and medical records.     36 year old male here with ongoing left testicle pain. He was seen here last night and had a ultrasound performed which revealed epididymitis. There is no evidence of torsion. He arrives here today with his prescriptions that he was given last night, he has not yet filled them as he reports "it was late" when he left here. States he is not sure why he still has pain. He denies any new symptoms or trauma. He has not had any difficulty urinating. No fever or chills.  States he took motrin at home without relief.  Past Medical History:  Diagnosis Date  . Allergy     Patient Active Problem List   Diagnosis Date Noted  . Plantar fasciitis of right foot 11/19/2016  . Increased thirst 11/19/2016  . Skin lesion of right arm 11/19/2016  . Unexplained weight gain 11/19/2016    History reviewed. No pertinent surgical history.     Home Medications    Prior to Admission medications   Medication Sig Start Date End Date Taking? Authorizing Provider  doxycycline (VIBRAMYCIN) 100 MG capsule Take 1 capsule (100 mg total) by mouth 2 (two) times daily. 02/22/17   Renne Crigler, PA-C  ibuprofen (ADVIL,MOTRIN) 800 MG tablet Take 1 tablet (800 mg total) by mouth 3 (three) times daily. 02/22/17   Renne Crigler, PA-C    Family History Family History  Problem Relation Age of Onset  . Diabetes Mother   . Arthritis Mother   . Hypertension Sister   . Diabetes Sister     Social History Social History  Substance Use Topics  . Smoking status: Never Smoker  . Smokeless tobacco: Never Used  . Alcohol use Yes     Comment: socially     Allergies   Fish allergy   Review of Systems Review of Systems    Genitourinary: Positive for testicular pain.  All other systems reviewed and are negative.    Physical Exam Updated Vital Signs BP (!) 147/64 (BP Location: Left Arm)   Pulse 92   Temp 97.9 F (36.6 C) (Oral)   Resp 16   Ht  (1.88 m)   Wt 77.1 kg   SpO2 99%   BMI 21.83 kg/m   Physical Exam  Constitutional: He is oriented to person, place, and time. He appears well-developed and well-nourished.  HENT:  Head: Normocephalic and atraumatic.  Mouth/Throat: Oropharynx is clear and moist.  Eyes: Conjunctivae and EOM are normal. Pupils are equal, round, and reactive to light.  Neck: Normal range of motion.  Cardiovascular: Normal rate, regular rhythm and normal heart sounds.   Pulmonary/Chest: Effort normal and breath sounds normal. No respiratory distress. He has no wheezes.  Abdominal: Soft. Bowel sounds are normal.  Musculoskeletal: Normal range of motion.  Neurological: He is alert and oriented to person, place, and time.  Skin: Skin is warm and dry.  Psychiatric: He has a normal mood and affect.  Nursing note and vitals reviewed.    ED Treatments / Results  Labs (all labs ordered are listed, but only abnormal results are displayed) Labs Reviewed - No data to display  EKG  EKG Interpretation None  Radiology US Scrotum  Result Date: 02/22/2017 CLINICAL DATA:  Initial evaluation for intermittent left testicular pain and swelling for 3-5 days. EXAM: ULTRASOUND OF SCROTUM TECHNIQUE: Complete ultrasound examination of the testicles, epididymis, and other scrotal structures was performed. COMPARISON:  None. FINDINGS: Right testicle Measurements: 4.4 x 2.4 x 2.4 cm. No mass or microlithiasis visualized. Left testicle Measurements: 4.3 x 3.1 x 3.0 cm. No mass or microlithiasis visualized. Right epididymis:  Normal in size and appearance. Left epididymis: Left epididymis is asymmetrically enlarged, heterogeneous in appearance, with relatively increased vascularity.  Finding suggestive of acute left epididymitis. Hydrocele:  Small left hydrocele, likely reactive. Varicocele:  None visualized. IMPRESSION: 1. Findings suggestive of acute left epididymitis as above. 2. Small left hydrocele, likely reactive. Electronically Signed   By: Rise Mu M.D.   On: 02/22/2017 01:21   [place] Art/ven Flow Abd Pelv Doppler  Result Date: 02/22/2017 CLINICAL DATA:  Initial evaluation for intermittent left testicular pain and swelling for 3-5 days. EXAM: ULTRASOUND OF SCROTUM TECHNIQUE: Complete ultrasound examination of the testicles, epididymis, and other scrotal structures was performed. COMPARISON:  None. FINDINGS: Right testicle Measurements: 4.4 x 2.4 x 2.4 cm. No mass or microlithiasis visualized. Left testicle Measurements: 4.3 x 3.1 x 3.0 cm. No mass or microlithiasis visualized. Right epididymis:  Normal in size and appearance. Left epididymis: Left epididymis is asymmetrically enlarged, heterogeneous in appearance, with relatively increased vascularity. Finding suggestive of acute left epididymitis. Hydrocele:  Small left hydrocele, likely reactive. Varicocele:  None visualized. IMPRESSION: 1. Findings suggestive of acute left epididymitis as above. 2. Small left hydrocele, likely reactive. Electronically Signed   By: Rise Mu M.D.   On: 02/22/2017 01:21    Procedures Procedures (including critical care time)  Medications Ordered in ED Medications  fentaNYL (SUBLIMAZE) injection 50 mcg (50 mcg Nasal Given 02/22/17 0941)     Initial Impression / Assessment and Plan / ED Course  I have reviewed the triage vital signs and the nursing notes.  Pertinent labs & imaging results that were available during my care of the patient were reviewed by me and considered in my medical decision making (see chart for details).  36 year old male here with continued left testicle pain. Diagnosed with epididymitis late last night, however has yet to fill his  prescription he was given.  No new changes in symptoms.  No fever, chills. No issues urinating.  He was treated for STDs here and discharged home on doxycycline.  As he has not had any change in symptoms, do not feel he needs repeat labs or imaging at this time. I prescribed him a few Vicodin for pain. I've advised him that he must take the antibiotics prescribed, his symptoms are unlikely to improve without this.  He was given urology follow-up for any ongoing issues.  Discussed plan with patient, he acknowledged understanding and agreed with plan of care.  Return precautions given for new or worsening symptoms.  Final Clinical Impressions(s) / ED Diagnoses   Final diagnoses:  Epididymitis    New Prescriptions Discharge Medication List as of 02/22/2017 10:19 AM    START taking these medications   Details  HYDROcodone-acetaminophen (NORCO/VICODIN) 5-325 MG tablet Take 1 tablet by mouth every 4 (four) hours as needed., Starting Fri 02/22/2017, Print         Garlon Hatchet, PA-C 02/22/17 1034    Azalia Bilis, MD 02/22/17 1553

## 2017-02-22 NOTE — ED Triage Notes (Addendum)
Pt seen yesterday for left abdominal pain and diagnosed with epididymitis and swelling of left testicle. Pt verbalizes went home and took motrin as prescribed without relief. Pt verbalizes here for pain management.

## 2017-02-22 NOTE — Discharge Instructions (Signed)
Please read and follow all provided instructions.  Your diagnoses today include:  1. Epididymitis   2. Swelling of left testicle     Tests performed today include:  Test for gonorrhea and chlamydia.   Test for HIV and syphilis.   You will be notified by telephone with any positive results.   Vital signs. See below for your results today.   Medications:  You were treated with doxycycline and rocephin today. These antibiotics treat you for gonorrhea and chlamydia. They do not treat for HIV or syphilis.   Home care instructions:  Read educational materials contained in this packet and follow any instructions provided.   Sexually transmitted disease testing also available at:   [facility], [facility]  [address] [place], phone [phone] or [phone]    Monday - Friday, call for an appointment  [facility] of [place], [facility]  [address], [place], phone [phone] or [phone]   Monday - Friday, call for an appointment  Return instructions:   Please return to the Emergency Department if you experience worsening symptoms.   Please return if you have any other emergent concerns.  Additional Information:  Your vital signs today were: BP 126/85 (BP Location: Left Arm)    Pulse 98    Temp 99.1 F (37.3 C) (Oral)    Resp 18    SpO2 100%  If your blood pressure (BP) was elevated above 135/85 this visit, please have this repeated by your doctor within one month. --------------

## 2017-02-22 NOTE — ED Notes (Signed)
US at bedside

## 2017-02-23 LAB — RPR: RPR: NONREACTIVE

## 2017-02-23 LAB — HIV ANTIBODY (ROUTINE TESTING W REFLEX): HIV SCREEN 4TH GENERATION: NONREACTIVE

## 2017-03-15 ENCOUNTER — Inpatient Hospital Stay (HOSPITAL_COMMUNITY)
Admission: EM | Admit: 2017-03-15 | Discharge: 2017-03-17 | DRG: 854 | Disposition: A | Discharge status: Home / Self Care | Payer: Managed Care, Other (non HMO) | Attending: Internal Medicine | Admitting: Internal Medicine

## 2017-03-15 ENCOUNTER — Emergency Department (HOSPITAL_COMMUNITY): Payer: Managed Care, Other (non HMO)

## 2017-03-15 ENCOUNTER — Ambulatory Visit (INDEPENDENT_AMBULATORY_CARE_PROVIDER_SITE_OTHER): Payer: Managed Care, Other (non HMO) | Admitting: Medical

## 2017-03-15 ENCOUNTER — Encounter: Payer: Self-pay | Admitting: Medical

## 2017-03-15 ENCOUNTER — Encounter (HOSPITAL_COMMUNITY): Payer: Self-pay | Admitting: Emergency Medicine

## 2017-03-15 VITALS — BP 140/84 | HR 96 | Ht 72.0 in | Wt 193.4 lb

## 2017-03-15 DIAGNOSIS — N451 Epididymitis: Secondary | ICD-10-CM

## 2017-03-15 DIAGNOSIS — N454 Abscess of epididymis or testis: Secondary | ICD-10-CM | POA: Diagnosis present

## 2017-03-15 DIAGNOSIS — N5089 Other specified disorders of the male genital organs: Secondary | ICD-10-CM

## 2017-03-15 DIAGNOSIS — Z91013 Allergy to seafood: Secondary | ICD-10-CM

## 2017-03-15 DIAGNOSIS — N50819 Testicular pain, unspecified: Secondary | ICD-10-CM | POA: Diagnosis present

## 2017-03-15 DIAGNOSIS — N492 Inflammatory disorders of scrotum: Secondary | ICD-10-CM | POA: Insufficient documentation

## 2017-03-15 DIAGNOSIS — A419 Sepsis, unspecified organism: Principal | ICD-10-CM

## 2017-03-15 HISTORY — DX: Abscess of epididymis or testis: N45.4

## 2017-03-15 LAB — BASIC METABOLIC PANEL
ANION GAP: 8 (ref 5–15)
BUN: 7 mg/dL (ref 6–20)
CHLORIDE: 103 mmol/L (ref 101–111)
CO2: 25 mmol/L (ref 22–32)
Calcium: 9.4 mg/dL (ref 8.9–10.3)
Creatinine, Ser: 0.82 mg/dL (ref 0.61–1.24)
GFR calc Af Amer: >60 mL/min (ref >60)
GLUCOSE: 96 mg/dL (ref 65–99)
POTASSIUM: 3.6 mmol/L (ref 3.5–5.1)
Sodium: 136 mmol/L (ref 135–145)

## 2017-03-15 LAB — URINALYSIS, ROUTINE W REFLEX MICROSCOPIC
BACTERIA UA: NONE SEEN
Bilirubin Urine: NEGATIVE
Glucose, UA: NEGATIVE mg/dL
Ketones, ur: 20 mg/dL — AB
Leukocytes, UA: NEGATIVE
NITRITE: NEGATIVE
PROTEIN: 30 mg/dL — AB
SPECIFIC GRAVITY, URINE: 1.027 (ref 1.005–1.030)
pH: 5 (ref 5.0–8.0)

## 2017-03-15 LAB — CBC WITH DIFFERENTIAL/PLATELET
BASOS ABS: 0 10*3/uL (ref 0.0–0.1)
Basophils Relative: 0 %
EOS PCT: 1 %
Eosinophils Absolute: 0.2 10*3/uL (ref 0.0–0.7)
HEMATOCRIT: 39 % (ref 39.0–52.0)
HEMOGLOBIN: 12.8 g/dL — AB (ref 13.0–17.0)
LYMPHS ABS: 2.6 10*3/uL (ref 0.7–4.0)
Lymphocytes Relative: 17 %
MCH: 29.4 pg (ref 26.0–34.0)
MCHC: 32.8 g/dL (ref 30.0–36.0)
MCV: 89.7 fL (ref 78.0–100.0)
Monocytes Absolute: 1.4 10*3/uL — ABNORMAL HIGH (ref 0.1–1.0)
Monocytes Relative: 9 %
NEUTROS ABS: 11.5 10*3/uL — AB (ref 1.7–7.7)
NEUTROS PCT: 73 %
Platelets: 329 10*3/uL (ref 150–400)
RBC: 4.35 MIL/uL (ref 4.22–5.81)
RDW: 13.7 % (ref 11.5–15.5)
WBC: 15.7 10*3/uL — AB (ref 4.0–10.5)

## 2017-03-15 LAB — POCT URINALYSIS DIPSTICK
BILIRUBIN UA: NEGATIVE
GLUCOSE UA: NEGATIVE
Ketones, UA: NEGATIVE
Leukocytes, UA: NEGATIVE
Nitrite, UA: NEGATIVE
PH UA: 6 (ref 5.0–8.0)
Urobilinogen, UA: NEGATIVE E.U./dL — AB

## 2017-03-15 MED ORDER — LEVOFLOXACIN IN D5W 750 MG/150ML IV SOLN
750.0000 mg | Freq: Once | INTRAVENOUS | Status: AC
  Administered 2017-03-15: 750 mg via INTRAVENOUS
  Filled 2017-03-15: qty 150

## 2017-03-15 MED ORDER — ENOXAPARIN SODIUM 40 MG/0.4ML ~~LOC~~ SOLN
40.0000 mg | Freq: Every day | SUBCUTANEOUS | Status: DC
  Administered 2017-03-16: 40 mg via SUBCUTANEOUS
  Filled 2017-03-15: qty 0.4

## 2017-03-15 MED ORDER — MORPHINE SULFATE (PF) 4 MG/ML IV SOLN
4.0000 mg | Freq: Once | INTRAVENOUS | Status: AC
  Administered 2017-03-15: 4 mg via INTRAVENOUS
  Filled 2017-03-15: qty 1

## 2017-03-15 MED ORDER — CEFTRIAXONE SODIUM 2 G IJ SOLR
2.0000 g | Freq: Once | INTRAMUSCULAR | Status: AC
  Administered 2017-03-15: 2 g via INTRAVENOUS
  Filled 2017-03-15: qty 2

## 2017-03-15 MED ORDER — ONDANSETRON HCL 4 MG/2ML IJ SOLN
4.0000 mg | Freq: Once | INTRAMUSCULAR | Status: AC
  Administered 2017-03-15: 4 mg via INTRAVENOUS
  Filled 2017-03-15: qty 2

## 2017-03-15 NOTE — H&P (Signed)
History and Physical    Aaron Williamson RUE:[id number] DOB: 10-Apr-1981 DOA: 03/15/2017  PCP: Jac Canavan, PA-C  Patient coming from: Home  I have personally briefly reviewed patient's old medical records in [facility]  Chief Complaint: Testicular swelling and pain  HPI: Aaron Williamson is a 36 y.o. male with medical history significant of previously being seen in ED on 4/12 for pain swelling of testicle.  Treated with rocephin and doxy initially.  Had a little improvement but symptoms have persisted and ultimately worsened.    ED Course: US shows testicular abscess.  WBC 15k.  Given dose of levaquin.  Dr. Vernie Ammons consulted and says to start patient on rocephin and admit to medicine and that this typically isnt surgical.   Review of Systems: As per HPI otherwise 10 point review of systems negative.   Past Medical History:  Diagnosis Date  . Allergy     History reviewed. No pertinent surgical history.   reports that he has never smoked. He has never used smokeless tobacco. He reports that he drinks alcohol. He reports that he does not use drugs.  Allergies  Allergen Reactions  . Fish Allergy Swelling    Family History  Problem Relation Age of Onset  . Diabetes Mother   . Arthritis Mother   . Hypertension Sister   . Diabetes Sister      Prior to Admission medications   Medication Sig Start Date End Date Taking? Authorizing Provider  ibuprofen (ADVIL,MOTRIN) 200 MG tablet Take 200-400 mg by mouth every 6 (six) hours as needed for moderate pain.    Yes [provider]    Physical Exam: Vitals:   03/15/17 1626 03/15/17 1937 03/15/17 2236  BP: (!) 152/96 (!) 159/90 (!) 136/95  Pulse: 90 96 74  Resp: 16 18 18   Temp: 98.1 F (36.7 C)    TempSrc: Oral    SpO2: 100% 100% 98%  Weight:  87.5 kg (193 lb)   Height:  6' (1.829 m)     Constitutional: NAD, calm, comfortable Eyes: PERRL, lids and conjunctivae normal ENMT: Mucous membranes are  moist. Posterior pharynx clear of any exudate or lesions.Normal dentition.  Neck: normal, supple, no masses, no thyromegaly Respiratory: clear to auscultation bilaterally, no wheezing, no crackles. Normal respiratory effort. No accessory muscle use.  Cardiovascular: Regular rate and rhythm, no murmurs / rubs / gallops. No extremity edema. 2+ pedal pulses. No carotid bruits.  Abdomen: no tenderness, no masses palpated. No hepatosplenomegaly. Bowel sounds positive.  Musculoskeletal: no clubbing / cyanosis. No joint deformity upper and lower extremities. Good ROM, no contractures. Normal muscle tone.  Skin: no rashes, lesions, ulcers. No induration Neurologic: CN 2-12 grossly intact. Sensation intact, DTR normal. Strength 5/5 in all 4.  Psychiatric: Normal judgment and insight. Alert and oriented x 3. Normal mood.    Labs on Admission: I have personally reviewed following labs and imaging studies  CBC:  Recent Labs Lab 03/15/17 2116  WBC 15.7*  NEUTROABS 11.5*  HGB 12.8*  HCT 39.0  MCV 89.7  PLT 329   Basic Metabolic Panel:  Recent Labs Lab 03/15/17 2116  NA 136  K 3.6  CL 103  CO2 25  GLUCOSE 96  BUN 7  CREATININE 0.82  CALCIUM 9.4   GFR: Estimated Creatinine Clearance: 139.3 mL/min (by C-G formula based on SCr of 0.82 mg/dL). Liver Function Tests: No results for input(s): AST, ALT, ALKPHOS, BILITOT, PROT, ALBUMIN in the last 168 hours. No results for  input(s): LIPASE, AMYLASE in the last 168 hours. No results for input(s): AMMONIA in the last 168 hours. Coagulation Profile: No results for input(s): INR, PROTIME in the last 168 hours. Cardiac Enzymes: No results for input(s): CKTOTAL, CKMB, CKMBINDEX, TROPONINI in the last 168 hours. BNP (last 3 results) No results for input(s): PROBNP in the last 8760 hours. HbA1C: No results for input(s): HGBA1C in the last 72 hours. CBG: No results for input(s): GLUCAP in the last 168 hours. Lipid Profile: No results for  input(s): CHOL, HDL, LDLCALC, TRIG, CHOLHDL, LDLDIRECT in the last 72 hours. Thyroid Function Tests: No results for input(s): TSH, T4TOTAL, FREET4, T3FREE, THYROIDAB in the last 72 hours. Anemia Panel: No results for input(s): VITAMINB12, FOLATE, FERRITIN, TIBC, IRON, RETICCTPCT in the last 72 hours. Urine analysis:    Component Value Date/Time   COLORURINE AMBER (A) 03/15/2017 2120   APPEARANCEUR HAZY (A) 03/15/2017 2120   LABSPEC 1.027 03/15/2017 2120   PHURINE 5.0 03/15/2017 2120   GLUCOSEU NEGATIVE 03/15/2017 2120   HGBUR SMALL (A) 03/15/2017 2120   BILIRUBINUR NEGATIVE 03/15/2017 2120   BILIRUBINUR neg 03/15/2017 1446   KETONESUR 20 (A) 03/15/2017 2120   PROTEINUR 30 (A) 03/15/2017 2120   UROBILINOGEN negative (A) 03/15/2017 1446   UROBILINOGEN 1.0 01/11/2015 0720   NITRITE NEGATIVE 03/15/2017 2120   LEUKOCYTESUR NEGATIVE 03/15/2017 2120    Radiological Exams on Admission: [place] Scrotum  Result Date: 03/15/2017 CLINICAL DATA:  Testicular pain and swelling despite treating with antibiotics last month. No relief in symptoms. EXAM: SCROTAL ULTRASOUND DOPPLER ULTRASOUND OF THE TESTICLES TECHNIQUE: Complete ultrasound examination of the testicles, epididymis, and other scrotal structures was performed. Color and spectral Doppler ultrasound were also utilized to evaluate blood flow to the testicles. COMPARISON:  None. FINDINGS: Right testicle Measurements: 4.2 x 2.7 x 2.6 cm, previously 4.4 x 2.4 x 2.4 cm. No mass or microlithiasis visualized. Left testicle Measurements: 5 x 3 x 3.2 cm, previously 4.3 x 3.1 x 3 cm. The testicular parenchyma is now enlarged, heterogeneous and with areas of internal hypoechogenicity concerning for intratesticular abscesses. Right epididymis:  Normal in size and appearance. Left epididymis: Edematous and heterogeneous in appearance also with areas of hypoechogenicity noted within concerning for abscess and epididymitis. Hydrocele: None visualized on the right.  Concern for small left-sided pyocele given heterogeneous septated appearing fluid. Varicocele:  None visualized. Pulsed Doppler interrogation of both testes demonstrates normal low resistance arterial and venous waveforms bilaterally. IMPRESSION: 1. Interval enlargement of the left testicle and epididymis with internal areas of hypoechogenicity concerning for development of intra testicular and epididymal abscesses. Complex fluid is also suggested about the testicle and epididymis on the left and a small pyocele is not excluded. 2. No torsion is identified. Electronically Signed   By: Tollie Ethavid  Kwon M.D.   On: 03/15/2017 21:25   [place] Art/ven Flow Abd Pelv Doppler  Result Date: 03/15/2017 CLINICAL DATA:  Testicular pain and swelling despite treating with antibiotics last month. No relief in symptoms. EXAM: SCROTAL ULTRASOUND DOPPLER ULTRASOUND OF THE TESTICLES TECHNIQUE: Complete ultrasound examination of the testicles, epididymis, and other scrotal structures was performed. Color and spectral Doppler ultrasound were also utilized to evaluate blood flow to the testicles. COMPARISON:  None. FINDINGS: Right testicle Measurements: 4.2 x 2.7 x 2.6 cm, previously 4.4 x 2.4 x 2.4 cm. No mass or microlithiasis visualized. Left testicle Measurements: 5 x 3 x 3.2 cm, previously 4.3 x 3.1 x 3 cm. The testicular parenchyma is now enlarged, heterogeneous and with areas  of internal hypoechogenicity concerning for intratesticular abscesses. Right epididymis:  Normal in size and appearance. Left epididymis: Edematous and heterogeneous in appearance also with areas of hypoechogenicity noted within concerning for abscess and epididymitis. Hydrocele: None visualized on the right. Concern for small left-sided pyocele given heterogeneous septated appearing fluid. Varicocele:  None visualized. Pulsed Doppler interrogation of both testes demonstrates normal low resistance arterial and venous waveforms bilaterally. IMPRESSION: 1. Interval  enlargement of the left testicle and epididymis with internal areas of hypoechogenicity concerning for development of intra testicular and epididymal abscesses. Complex fluid is also suggested about the testicle and epididymis on the left and a small pyocele is not excluded. 2. No torsion is identified. Electronically Signed   By: Tollie Eth M.D.   On: 03/15/2017 21:25    EKG: Independently reviewed.  Assessment/Plan Principal Problem:   Testicular abscess    1. Testicular abscess - 1. Per Dr. Vernie Ammons, start on rocephin, doesn't require surgery. 2. Will therefore put patient on rocephin, anticipate 48 hrs of IV abx at least before attempting conversion to PO again. 3. Repeat CBC in AM.  DVT prophylaxis: Lovenox Code Status: Full Family Communication: Family at bedside Disposition Plan: Home after admit Consults called: EDP spoke with Ottelin who will consult. Admission status: Admit to inpatient - failed outpatient ABx   Hillary Bow DO [facility] Pager [phone]  If 7AM-7PM, please contact day team taking care of patient [email or web address] Password [email or web address]  03/15/2017, 11:26 PM

## 2017-03-15 NOTE — ED Triage Notes (Signed)
Pt reports he has had ongoing Testicle swelling since mid April. Was on abx, but swelling did not decrease. No pain or new symptoms. Was told by PCP to come here.

## 2017-03-15 NOTE — Progress Notes (Signed)
Subjective:     Patient ID: Aaron JunglingJonathan E Williamson, male   DOB: 12/05/1980, 36 y.o.   MRN: [id number]  HPI Here as a new patient. Was seeing Dr. 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  Was in usual state of health until mid April when he developed testicular swelling.   Went to the ED 02/21/17 for this initially, had labs, ultrasound, was treated with rocephin and doxycycline.  Had a little improvement but the swelling persists.   Is somewhat painful,worse if direct pressure from tight underwear.  Somewhat difficult time walking.   Prior to 02/2017, no hx/o testicular swelling or STD.   Denies burning with urination, discharge, but says skin feels irritated of left testicle, possible pus inside.  No fever, no lymphadenopathy, no hx/o diabetes or HIV.  No other aggravating or relieving factors.  No other complaint.  Past Medical History:  Diagnosis Date  . Allergy    No current outpatient prescriptions on file prior to visit.   No current facility-administered medications on file prior to visit.     Review of Systems As in subjective     Objective:   Physical Exam  BP 140/84   Pulse 96   Ht 6' (1.829 m)   Wt 193 lb 6.4 oz (87.7 kg)   SpO2 99%   BMI 26.23 kg/m   General appearance: alert, no distress, WD/WN, pleasant AA male Abdomen: +bs, soft, non tender, non distended, no masses, no hepatomegaly, no splenomegaly Pulses: 2+ symmetric, upper and lower extremities, normal cap refill GU: circumcised, grossly swollen bilat testicles and scrotum but left side worse with fullness in testicle, and overlying skin with fluctuance and skin coloration changes, suggestive of underlying abscess, no lymphadenopathy, no drainage      Assessment:     Encounter Diagnoses  Name Primary?  . Testicle pain Yes  . Testicular swelling   . Scrotal abscess        Plan:     Reviewed 02/21/17 ED visit, notes, labs, ultrasound.   He was given rocephin and  doxycyline.  Discussed his current symptoms, exam finding, and reviewed labs and US from hospital visit.  Discussed case with Dr. Susann GivensLalonde supervising physician who also examined him.     I called [facility] today.  They can't get him in today or soon, so they advised he be [facility] with possible consult with urology in the ED.     I discussed with patient and advised he report to [facility] ED for further eval and treatment, likely I&D today.     He voiced understanding and agreement of plan  Aaron Williamson was seen today for new pt.  Diagnoses and all orders for this visit:  Testicle pain -     Urinalysis Dipstick  Testicular swelling  Scrotal abscess

## 2017-03-15 NOTE — ED Notes (Signed)
Ultrasound in process at bedside at this time. 

## 2017-03-15 NOTE — Progress Notes (Signed)
Pharmacy Antibiotic Note  Aaron Williamson is a 36 y.o. male c/o testicular swelling admitted on 03/15/2017 with testicular abscess.  Pharmacy has been consulted for rocephin dosing.  Plan: Rocephin 2 Gm IV q24h  Height: 6' (182.9 cm) Weight: 193 lb (87.5 kg) IBW/kg (Calculated) : 77.6  Temp (24hrs), Avg:98.1 F (36.7 C), Min:98.1 F (36.7 C), Max:98.1 F (36.7 C)   Recent Labs Lab 03/15/17 2116  WBC 15.7*  CREATININE 0.82    Estimated Creatinine Clearance: 139.3 mL/min (by C-G formula based on SCr of 0.82 mg/dL).    Allergies  Allergen Reactions  . Fish Allergy Swelling    Antimicrobials this admission: 5/4 rocephin >>    >>   Dose adjustments this admission:   Microbiology results:  BCx:   UCx:    Sputum:    MRSA PCR:   Thank you for allowing pharmacy to be a part of this patient's care.  Lorenza EvangelistGreen, Ajanee Buren R 03/15/2017 11:45 PM

## 2017-03-16 ENCOUNTER — Encounter (HOSPITAL_COMMUNITY): Payer: Self-pay | Admitting: Anesthesiology

## 2017-03-16 ENCOUNTER — Inpatient Hospital Stay (HOSPITAL_COMMUNITY): Payer: Managed Care, Other (non HMO) | Admitting: Anesthesiology

## 2017-03-16 ENCOUNTER — Encounter (HOSPITAL_COMMUNITY)
Admission: EM | Disposition: A | Discharge status: Home / Self Care | Payer: Self-pay | Source: Home / Self Care | Attending: Internal Medicine

## 2017-03-16 ENCOUNTER — Ambulatory Visit (HOSPITAL_BASED_OUTPATIENT_CLINIC_OR_DEPARTMENT_OTHER): Payer: Self-pay | Admitting: Urology

## 2017-03-16 DIAGNOSIS — N50819 Testicular pain, unspecified: Secondary | ICD-10-CM

## 2017-03-16 DIAGNOSIS — A419 Sepsis, unspecified organism: Principal | ICD-10-CM

## 2017-03-16 DIAGNOSIS — N451 Epididymitis: Secondary | ICD-10-CM

## 2017-03-16 HISTORY — PX: ORCHIECTOMY: SHX2116

## 2017-03-16 LAB — BASIC METABOLIC PANEL
Anion gap: 6 (ref 5–15)
BUN: 7 mg/dL (ref 6–20)
CHLORIDE: 101 mmol/L (ref 101–111)
CO2: 27 mmol/L (ref 22–32)
CREATININE: 0.95 mg/dL (ref 0.61–1.24)
Calcium: 9 mg/dL (ref 8.9–10.3)
GFR calc Af Amer: >60 mL/min (ref >60)
GFR calc non Af Amer: >60 mL/min (ref >60)
GLUCOSE: 106 mg/dL — AB (ref 65–99)
POTASSIUM: 3.8 mmol/L (ref 3.5–5.1)
Sodium: 134 mmol/L — ABNORMAL LOW (ref 135–145)

## 2017-03-16 LAB — CBC
HCT: 39.5 % (ref 39.0–52.0)
HEMOGLOBIN: 13 g/dL (ref 13.0–17.0)
MCH: 28.9 pg (ref 26.0–34.0)
MCHC: 32.9 g/dL (ref 30.0–36.0)
MCV: 87.8 fL (ref 78.0–100.0)
Platelets: 330 10*3/uL (ref 150–400)
RBC: 4.5 MIL/uL (ref 4.22–5.81)
RDW: 13.9 % (ref 11.5–15.5)
WBC: 13.9 10*3/uL — ABNORMAL HIGH (ref 4.0–10.5)

## 2017-03-16 LAB — SURGICAL PCR SCREEN
MRSA, PCR: NEGATIVE
Staphylococcus aureus: NEGATIVE

## 2017-03-16 SURGERY — ORCHIECTOMY
Anesthesia: General | Site: Scrotum

## 2017-03-16 MED ORDER — MIDAZOLAM HCL 5 MG/5ML IJ SOLN
INTRAMUSCULAR | Status: DC | PRN
  Administered 2017-03-16: 2 mg via INTRAVENOUS

## 2017-03-16 MED ORDER — PROPOFOL 10 MG/ML IV BOLUS
INTRAVENOUS | Status: AC
  Filled 2017-03-16: qty 20

## 2017-03-16 MED ORDER — HYDROCODONE-ACETAMINOPHEN 5-325 MG PO TABS
1.0000 | ORAL_TABLET | ORAL | Status: DC | PRN
  Administered 2017-03-16 – 2017-03-17 (×2): 1 via ORAL
  Filled 2017-03-16 (×3): qty 1

## 2017-03-16 MED ORDER — FENTANYL CITRATE (PF) 100 MCG/2ML IJ SOLN
INTRAMUSCULAR | Status: DC | PRN
  Administered 2017-03-16 (×3): 25 ug via INTRAVENOUS
  Administered 2017-03-16: 50 ug via INTRAVENOUS
  Administered 2017-03-16 (×2): 25 ug via INTRAVENOUS

## 2017-03-16 MED ORDER — LACTATED RINGERS IV SOLN: INTRAVENOUS | Status: DC

## 2017-03-16 MED ORDER — FENTANYL CITRATE (PF) 100 MCG/2ML IJ SOLN
INTRAMUSCULAR | Status: AC
  Filled 2017-03-16: qty 2

## 2017-03-16 MED ORDER — BUPIVACAINE HCL (PF) 0.5 % IJ SOLN
INTRAMUSCULAR | Status: AC
  Filled 2017-03-16: qty 30

## 2017-03-16 MED ORDER — ACETAMINOPHEN 325 MG PO TABS: 650.0000 mg | ORAL_TABLET | Freq: Four times a day (QID) | ORAL | Status: DC | PRN

## 2017-03-16 MED ORDER — PROPOFOL 10 MG/ML IV BOLUS
INTRAVENOUS | Status: DC | PRN
  Administered 2017-03-16: 50 mg via INTRAVENOUS
  Administered 2017-03-16: 200 mg via INTRAVENOUS

## 2017-03-16 MED ORDER — LIDOCAINE 2% (20 MG/ML) 5 ML SYRINGE
INTRAMUSCULAR | Status: DC | PRN
  Administered 2017-03-16: 50 mg via INTRAVENOUS

## 2017-03-16 MED ORDER — MIDAZOLAM HCL 2 MG/2ML IJ SOLN
INTRAMUSCULAR | Status: AC
  Filled 2017-03-16: qty 2

## 2017-03-16 MED ORDER — DEXTROSE 5 % IV SOLN
2.0000 g | INTRAVENOUS | Status: DC
  Administered 2017-03-16: 2 g via INTRAVENOUS
  Filled 2017-03-16 (×2): qty 2

## 2017-03-16 MED ORDER — EPINEPHRINE PF 1 MG/ML IJ SOLN
INTRAMUSCULAR | Status: AC
  Filled 2017-03-16: qty 1

## 2017-03-16 MED ORDER — FENTANYL CITRATE (PF) 100 MCG/2ML IJ SOLN: 25.0000 ug | INTRAMUSCULAR | Status: DC | PRN

## 2017-03-16 MED ORDER — OXYCODONE HCL 5 MG/5ML PO SOLN
5.0000 mg | Freq: Once | ORAL | Status: DC | PRN
  Filled 2017-03-16: qty 5

## 2017-03-16 MED ORDER — LACTATED RINGERS IV SOLN
INTRAVENOUS | Status: DC | PRN
  Administered 2017-03-16: 09:00:00 via INTRAVENOUS

## 2017-03-16 MED ORDER — BACITRACIN ZINC 500 UNIT/GM EX OINT
TOPICAL_OINTMENT | CUTANEOUS | Status: AC
  Filled 2017-03-16: qty 28.35

## 2017-03-16 MED ORDER — 0.9 % SODIUM CHLORIDE (POUR BTL) OPTIME
TOPICAL | Status: DC | PRN
  Administered 2017-03-16: 1000 mL

## 2017-03-16 MED ORDER — ONDANSETRON HCL 4 MG/2ML IJ SOLN
INTRAMUSCULAR | Status: DC | PRN
  Administered 2017-03-16: 4 mg via INTRAVENOUS

## 2017-03-16 MED ORDER — ONDANSETRON HCL 4 MG/2ML IJ SOLN: 4.0000 mg | Freq: Four times a day (QID) | INTRAMUSCULAR | Status: DC | PRN

## 2017-03-16 MED ORDER — MORPHINE SULFATE (PF) 4 MG/ML IV SOLN
2.0000 mg | INTRAVENOUS | Status: DC | PRN
  Administered 2017-03-16 – 2017-03-17 (×4): 2 mg via INTRAVENOUS
  Filled 2017-03-16 (×4): qty 1

## 2017-03-16 MED ORDER — OXYCODONE HCL 5 MG PO TABS: 5.0000 mg | ORAL_TABLET | Freq: Once | ORAL | Status: DC | PRN

## 2017-03-16 MED ORDER — BUPIVACAINE HCL (PF) 0.5 % IJ SOLN
INTRAMUSCULAR | Status: DC | PRN
  Administered 2017-03-16: 10 mL

## 2017-03-16 MED ORDER — HYDROCODONE-ACETAMINOPHEN 5-325 MG PO TABS
1.0000 | ORAL_TABLET | Freq: Four times a day (QID) | ORAL | Status: DC | PRN
  Administered 2017-03-16 (×2): 1 via ORAL
  Filled 2017-03-16 (×2): qty 1

## 2017-03-16 SURGICAL SUPPLY — 35 items
ADH SKN CLS APL DERMABOND .7 (GAUZE/BANDAGES/DRESSINGS) ×1
APL SKNCLS STERI-STRIP NONHPOA (GAUZE/BANDAGES/DRESSINGS)
BENZOIN TINCTURE PRP APPL 2/3 (GAUZE/BANDAGES/DRESSINGS) IMPLANT
BNDG GAUZE ELAST 4 BULKY (GAUZE/BANDAGES/DRESSINGS) ×1 IMPLANT
COVER SURGICAL LIGHT HANDLE (MISCELLANEOUS) ×2 IMPLANT
DERMABOND ADVANCED (GAUZE/BANDAGES/DRESSINGS) ×1
DERMABOND ADVANCED .7 DNX12 (GAUZE/BANDAGES/DRESSINGS) ×1 IMPLANT
DRAIN PENROSE 18X1/4 LTX STRL (WOUND CARE) ×2 IMPLANT
DRAPE LAPAROTOMY T 98X78 PEDS (DRAPES) ×2 IMPLANT
ELECT REM PT RETURN 15FT ADLT (MISCELLANEOUS) ×2 IMPLANT
GAUZE SPONGE 4X4 12PLY STRL (GAUZE/BANDAGES/DRESSINGS) ×1 IMPLANT
GLOVE BIOGEL M 8.0 STRL (GLOVE) ×2 IMPLANT
GOWN STRL REUS W/TWL XL LVL3 (GOWN DISPOSABLE) ×2 IMPLANT
KIT BASIN OR (CUSTOM PROCEDURE TRAY) ×2 IMPLANT
NEEDLE HYPO 22GX1.5 SAFETY (NEEDLE) ×2 IMPLANT
NS IRRIG 1000ML POUR BTL (IV SOLUTION) ×2 IMPLANT
PACK GENERAL/GYN (CUSTOM PROCEDURE TRAY) ×2 IMPLANT
PAD ABD 8X10 STRL (GAUZE/BANDAGES/DRESSINGS) ×1 IMPLANT
STRIP CLOSURE SKIN 1/2X4 (GAUZE/BANDAGES/DRESSINGS) IMPLANT
SUPPORT SCROTAL LG STRP (MISCELLANEOUS) ×2 IMPLANT
SUT CHROMIC 2 0 SH (SUTURE) IMPLANT
SUT CHROMIC 3 0 SH 27 (SUTURE) ×1 IMPLANT
SUT ETHILON 3 0 PS 1 (SUTURE) ×1 IMPLANT
SUT MNCRL AB 4-0 PS2 18 (SUTURE) IMPLANT
SUT SILK 0 (SUTURE) ×2
SUT SILK 0 30XBRD TIE 6 (SUTURE) ×1 IMPLANT
SUT SILK 2 0 (SUTURE) ×2
SUT SILK 2-0 18XBRD TIE 12 (SUTURE) ×1 IMPLANT
SUT SILK 3 0 SH 30 (SUTURE) IMPLANT
SUT VIC AB 2-0 SH 27 (SUTURE) ×2
SUT VIC AB 2-0 SH 27X BRD (SUTURE) IMPLANT
SUT VIC AB 3-0 SH 27 (SUTURE)
SUT VIC AB 3-0 SH 27XBRD (SUTURE) IMPLANT
SYR CONTROL 10ML LL (SYRINGE) ×2 IMPLANT
TOWEL OR 17X26 10 PK STRL BLUE (TOWEL DISPOSABLE) ×3 IMPLANT

## 2017-03-16 NOTE — Anesthesia Procedure Notes (Signed)
Procedure Name: LMA Insertion Date/Time: 03/16/2017 9:32 AM Performed by: Paris LoreBLANTON, Kairo Laubacher M Pre-anesthesia Checklist: Patient identified, Emergency Drugs available, Suction available, Patient being monitored and Timeout performed Patient Re-evaluated:Patient Re-evaluated prior to inductionOxygen Delivery Method: Circle system utilized Preoxygenation: Pre-oxygenation with 100% oxygen Intubation Type: IV induction Ventilation: Mask ventilation without difficulty LMA: LMA inserted LMA Size: 4.0 Number of attempts: 1 Placement Confirmation: positive ETCO2 and breath sounds checked- equal and bilateral Tube secured with: Tape

## 2017-03-16 NOTE — ED Provider Notes (Signed)
[facility]-EMERGENCY DEPT Provider Note   CSN: [id number] Arrival date & time: 03/15/17  1548     History   Chief Complaint Chief Complaint  Patient presents with  . Groin Swelling    HPI Aaron Williamson is a 36 y.o. male.  HPI   36 year old male with past medical history as below, seen and treated for epididymitis in the emergency department several weeks ago who presents with ongoing testicle pain. The patient states that the antibiotics did improve his testicle pain but he has had persistent swelling. Over the last several nights, his pain has progressively worsened and he has developed progressively worsening scrotal swelling. He describes an aching, throbbing, fullness sensation that is worse with any movement or palpation. He denies any drainage. Denies any dysuria. He has had some subjective fevers and chills but no vomiting. He was seen by his primary care doctor and sent here for further evaluation. Pain does not seem to improve with anything other than rest.  Past Medical History:  Diagnosis Date  . Allergy     Patient Active Problem List   Diagnosis Date Noted  . Scrotal abscess 03/15/2017  . Testicle pain 03/15/2017  . Testicular swelling 03/15/2017  . Testicular abscess 03/15/2017  . Plantar fasciitis of right foot 11/19/2016  . Increased thirst 11/19/2016  . Skin lesion of right arm 11/19/2016  . Unexplained weight gain 11/19/2016    History reviewed. No pertinent surgical history.     Home Medications    Prior to Admission medications   Medication Sig Start Date End Date Taking? Authorizing Provider  ibuprofen (ADVIL,MOTRIN) 200 MG tablet Take 200-400 mg by mouth every 6 (six) hours as needed for moderate pain.    Yes [provider]    Family History Family History  Problem Relation Age of Onset  . Diabetes Mother   . Arthritis Mother   . Hypertension Sister   . Diabetes Sister     Social History Social History  Substance Use Topics    . Smoking status: Never Smoker  . Smokeless tobacco: Never Used  . Alcohol use Yes     Comment: socially     Allergies   Fish allergy   Review of Systems Review of Systems  Constitutional: Positive for chills and fatigue.  Genitourinary: Positive for scrotal swelling and testicular pain.  All other systems reviewed and are negative.    Physical Exam Updated Vital Signs BP (!) 141/79   Pulse 80   Temp 98.1 F (36.7 C) (Oral)   Resp 18   Ht 6' (1.829 m)   Wt 193 lb (87.5 kg)   SpO2 96%   BMI 26.18 kg/m   Physical Exam  Constitutional: He is oriented to person, place, and time. He appears well-developed and well-nourished. No distress.  HENT:  Head: Normocephalic and atraumatic.  Eyes: Conjunctivae are normal.  Neck: Neck supple.  Cardiovascular: Normal rate, regular rhythm and normal heart sounds.  Exam reveals no friction rub.   No murmur heard. Pulmonary/Chest: Effort normal and breath sounds normal. No respiratory distress. He has no wheezes. He has no rales.  Abdominal: He exhibits no distension.  Genitourinary:  Genitourinary Comments: Markedly enlarged left>right hemiscrotum. There is palpable fluctuance of the left hemiscrotum with diffuse induration making palpation of testicle difficult. No open wounds or drainage. Significant diffuse TTP.  Musculoskeletal: He exhibits no edema.  Neurological: He is alert and oriented to person, place, and time. He exhibits normal muscle tone.  Skin: Skin is  warm. Capillary refill takes less than 2 seconds.  Psychiatric: He has a normal mood and affect.  Nursing note and vitals reviewed.    ED Treatments / Results  Labs (all labs ordered are listed, but only abnormal results are displayed) Labs Reviewed  URINALYSIS, ROUTINE W REFLEX MICROSCOPIC - Abnormal; Notable for the following:       Result Value   Color, Urine AMBER (*)    APPearance HAZY (*)    Hgb urine dipstick SMALL (*)    Ketones, ur 20 (*)    Protein,  ur 30 (*)    Squamous Epithelial / LPF 0-5 (*)    All other components within normal limits  CBC WITH DIFFERENTIAL/PLATELET - Abnormal; Notable for the following:    WBC 15.7 (*)    Hemoglobin 12.8 (*)    Neutro Abs 11.5 (*)    Monocytes Absolute 1.4 (*)    All other components within normal limits  URINE CULTURE  BASIC METABOLIC PANEL  CBC  BASIC METABOLIC PANEL  GC/CHLAMYDIA PROBE AMP ([place]) NOT AT [facility]    EKG  EKG Interpretation None       Radiology US Scrotum  Result Date: 03/15/2017 CLINICAL DATA:  Testicular pain and swelling despite treating with antibiotics last month. No relief in symptoms. EXAM: SCROTAL ULTRASOUND DOPPLER ULTRASOUND OF THE TESTICLES TECHNIQUE: Complete ultrasound examination of the testicles, epididymis, and other scrotal structures was performed. Color and spectral Doppler ultrasound were also utilized to evaluate blood flow to the testicles. COMPARISON:  None. FINDINGS: Right testicle Measurements: 4.2 x 2.7 x 2.6 cm, previously 4.4 x 2.4 x 2.4 cm. No mass or microlithiasis visualized. Left testicle Measurements: 5 x 3 x 3.2 cm, previously 4.3 x 3.1 x 3 cm. The testicular parenchyma is now enlarged, heterogeneous and with areas of internal hypoechogenicity concerning for intratesticular abscesses. Right epididymis:  Normal in size and appearance. Left epididymis: Edematous and heterogeneous in appearance also with areas of hypoechogenicity noted within concerning for abscess and epididymitis. Hydrocele: None visualized on the right. Concern for small left-sided pyocele given heterogeneous septated appearing fluid. Varicocele:  None visualized. Pulsed Doppler interrogation of both testes demonstrates normal low resistance arterial and venous waveforms bilaterally. IMPRESSION: 1. Interval enlargement of the left testicle and epididymis with internal areas of hypoechogenicity concerning for development of intra testicular and epididymal abscesses. Complex  fluid is also suggested about the testicle and epididymis on the left and a small pyocele is not excluded. 2. No torsion is identified. Electronically Signed   By: Tollie Eth M.D.   On: 03/15/2017 21:25   [place] Art/ven Flow Abd Pelv Doppler  Result Date: 03/15/2017 CLINICAL DATA:  Testicular pain and swelling despite treating with antibiotics last month. No relief in symptoms. EXAM: SCROTAL ULTRASOUND DOPPLER ULTRASOUND OF THE TESTICLES TECHNIQUE: Complete ultrasound examination of the testicles, epididymis, and other scrotal structures was performed. Color and spectral Doppler ultrasound were also utilized to evaluate blood flow to the testicles. COMPARISON:  None. FINDINGS: Right testicle Measurements: 4.2 x 2.7 x 2.6 cm, previously 4.4 x 2.4 x 2.4 cm. No mass or microlithiasis visualized. Left testicle Measurements: 5 x 3 x 3.2 cm, previously 4.3 x 3.1 x 3 cm. The testicular parenchyma is now enlarged, heterogeneous and with areas of internal hypoechogenicity concerning for intratesticular abscesses. Right epididymis:  Normal in size and appearance. Left epididymis: Edematous and heterogeneous in appearance also with areas of hypoechogenicity noted within concerning for abscess and epididymitis. Hydrocele: None visualized on the  right. Concern for small left-sided pyocele given heterogeneous septated appearing fluid. Varicocele:  None visualized. Pulsed Doppler interrogation of both testes demonstrates normal low resistance arterial and venous waveforms bilaterally. IMPRESSION: 1. Interval enlargement of the left testicle and epididymis with internal areas of hypoechogenicity concerning for development of intra testicular and epididymal abscesses. Complex fluid is also suggested about the testicle and epididymis on the left and a small pyocele is not excluded. 2. No torsion is identified. Electronically Signed   By: Tollie Ethavid  Kwon M.D.   On: 03/15/2017 21:25    Procedures Procedures (including critical care  time)  Medications Ordered in ED Medications  enoxaparin (LOVENOX) injection 40 mg (not administered)  cefTRIAXone (ROCEPHIN) 2 g in dextrose 5 % 50 mL IVPB (not administered)  morphine 4 MG/ML injection 4 mg (4 mg Intravenous Given 03/15/17 2118)  ondansetron (ZOFRAN) injection 4 mg (4 mg Intravenous Given 03/15/17 2117)  cefTRIAXone (ROCEPHIN) 2 g in dextrose 5 % 50 mL IVPB (0 g Intravenous Stopped 03/15/17 2241)  levofloxacin (LEVAQUIN) IVPB 750 mg (0 mg Intravenous Stopped 03/15/17 2336)     Initial Impression / Assessment and Plan / ED Course  I have reviewed the triage vital signs and the nursing notes.  Pertinent labs & imaging results that were available during my care of the patient were reviewed by me and considered in my medical decision making (see chart for details).     36 year old male here with significant scrotal swelling. Exam and ultrasound is consistent with epididymal abscess as well as testicular abscesses. I discussed with Dr. Vernie Ammonsttelin of urology and will admit to medicine for IV antibiotics. He will evaluate. Will give him Rocephin for empiric STD coverage as well as Levaquin to cover for possible enteric organisms given the failure of outpatient doxycycline.  Final Clinical Impressions(s) / ED Diagnoses   Final diagnoses:  Testicle pain  Testicular abscess  Epididymitis    New Prescriptions Current Discharge Medication List       Shaune PollackIsaacs, Elisha Mcgruder, MD 03/16/17 1126

## 2017-03-16 NOTE — Op Note (Addendum)
PATIENT:  Aaron JunglingJonathan E Williamson  PRE-OPERATIVE DIAGNOSIS: 1. Scrotal skin abscess 2. Left testicular abscess  POST-OPERATIVE DIAGNOSIS: Same  PROCEDURE: 1. Scrotal exploration with drainage of scrotal skin abscess 2. Left orchiectomy  SURGEON:  Garnett FarmMark C Brailee Riede  INDICATION: Aaron JunglingJonathan E Vanmeter is a 36 year old male who initially developed left epididymitis clinically and was placed on appropriate antibiotics but had continued progression of swelling and pain in the left hemiscrotum. An ultrasound revealed no definite scrotal wall abscess but what appeared to be possible abscesses of the testicle and epididymis. He was noted clinically to possibly have a scrotal wall abscess and therefore is brought to the operating room for drainage of this and exploration of the left hemiscrotum.  ANESTHESIA:  General  EBL:  Minimal  DRAINS: 1/4 inch Penrose drain in the left hemiscrotum  LOCAL MEDICATIONS USED:  1/2% Marcaine with epinephrine  SPECIMEN:  1.Left testicle and portion of left spermatic cord to pathology 2. Aerobic and anaerobic cultures from abscess.  Description of procedure: After informed consent the patient was taken to the operating room and placed on the table in a supine position. General anesthesia was then administered. Once fully anesthetized the genitalia were sterilely prepped and draped in standard fashion. An official timeout was then performed.  An incision was made transversely over the area of fluctuance in the left hemiscrotum and light tan purulent material was expressed. This was cultured with aerobic and anaerobic cultures. It was then irrigated and loculations were broken up digitally. I noted a very indurated left hemiscrotum with the testicle difficult to palpate due to surrounding induration. I was able to first bluntly dissect around the spermatic cord and placed a Penrose drain around the cord to aid in dissection. I was then able to dissect the testicle free from the  scrotum and it was noted to be abscessed. It was felt this would likely serve as a continued source of infection if not removed. I therefore elected to proceed with left orchiectomy.  A tonsil clamp was placed through the left spermatic cord dividing it into 2 equal portions and Kelly clamps were then placed across these 2 portions of the cord proximally. I then tied each cord section with #1 silk above each of the clamps. I suture ligated each of these cord stumps as well using 2-0 Vicryl. I then divided the cord and checked the cord stumps for bleeding and noted none. The wound was again irrigated copiously with saline and bleeding points were controlled with Bovie electrocautery. Local anesthetic was placed in the subcutaneous tissue of the skin incision. I placed a quarter inch Penrose drain up near the cord stumps and brought this out through the lower aspect of the skin incision. I excised some very thin skin that did not appear to be viable and then closed the scrotal skin from the superior aspect of the incision loosely approximating the skin with a 3-0 chromic in running fashion and leaving the bottom portion of the incision open. A 3-0 nylon suture was then placed through the skin and used to secure a sterile safety pin which was placed through the Penrose drain. The wound was then covered with antibiotic ointment then sterile 4 x 4's and fluffed Curlex with an ABD to hold these all in place using mesh briefs and the patient was awakened and taken to the recovery room in stable and satisfactory condition. He tolerated the procedure well and there were no intraoperative complications. Needle, sponge and instrument counts were correct 2  at the end of the operation.  PLAN OF CARE: Discharge to home after PACU  PATIENT DISPOSITION:  PACU - hemodynamically stable.

## 2017-03-16 NOTE — Anesthesia Preprocedure Evaluation (Signed)
Anesthesia Evaluation  Patient identified by MRN, date of birth, ID band Patient awake    Reviewed: Allergy & Precautions, H&P , NPO status , Patient's Chart, lab work & pertinent test results  Airway Mallampati: II   Neck ROM: full    Dental   Pulmonary neg pulmonary ROS,    breath sounds clear to auscultation       Cardiovascular negative cardio ROS   Rhythm:regular Rate:Normal     Neuro/Psych    GI/Hepatic   Endo/Other    Renal/GU    Scrotal abscess    Musculoskeletal   Abdominal   Peds  Hematology   Anesthesia Other Findings   Reproductive/Obstetrics                             Anesthesia Physical Anesthesia Plan  ASA: I  Anesthesia Plan: General   Post-op Pain Management:    Induction: Intravenous  Airway Management Planned: LMA  Additional Equipment:   Intra-op Plan:   Post-operative Plan:   Informed Consent: I have reviewed the patients History and Physical, chart, labs and discussed the procedure including the risks, benefits and alternatives for the proposed anesthesia with the patient or authorized representative who has indicated his/her understanding and acceptance.     Plan Discussed with: CRNA, Anesthesiologist and Surgeon  Anesthesia Plan Comments:         Anesthesia Quick Evaluation

## 2017-03-16 NOTE — Progress Notes (Signed)
PROGRESS NOTE    Aaron Williamson  UEA:[id number] DOB: [birth year] DOA: 03/15/2017 PCP: Jac Canavan, PA-C   Chief Complaint  Patient presents with  . Groin Swelling    Brief Narrative:  HPI on 03/15/2017 by Dr. Lyda Perone Aaron Williamson is a 36 y.o. male with medical history significant of previously being seen in ED on 4/12 for pain swelling of testicle.  Treated with rocephin and doxy initially.  Had a little improvement but symptoms have persisted and ultimately worsened. Assessment & Plan   Sepsis secondary to Scrotal abscess -Up on admission, patient was noted to have some tachycardia with leukocytosis -Vital signs improving, leukocytosis also improving -Scrotal ultrasound showed interval enlargement of the left testicle and epididymis with internal areas of hypoechogenicity concerning for intratesticular and epididymal abscesses -Urology consult and appreciated, s/p scrotal exploration with drainage of scrotal skin abscess, left orchietomy  -This is an ongoing issue since April 2018, patient has been on antibiotics -Continue ceftriaxone and pain control  DVT Prophylaxis  Lovenox  Code Status: full  Family Communication: none at bedside  Disposition Plan: Admitted. Possible discharge to home in 24 hours  Consultants Urology   Procedures  Scrotal US Scrotal exploration with drainage of scrotal skin abscess, left orchietomy   Antibiotics   Anti-infectives    Start     Dose/Rate Route Frequency Ordered Stop   03/16/17 2200  cefTRIAXone (ROCEPHIN) 2 g in dextrose 5 % 50 mL IVPB     2 g 100 mL/hr over 30 Minutes Intravenous Every 24 hours 03/16/17 0008     03/15/17 2215  cefTRIAXone (ROCEPHIN) 2 g in dextrose 5 % 50 mL IVPB     2 g 100 mL/hr over 30 Minutes Intravenous  Once 03/15/17 2203 03/15/17 2241   03/15/17 2215  levofloxacin (LEVAQUIN) IVPB 750 mg     750 mg 100 mL/hr over 90 Minutes Intravenous  Once 03/15/17 2203 03/15/17 2336      Subjective:     [name] seen and examined today.  Feels pain is intermittent, currently feels no pain. Denies chest pain, shortness of breath, abdominal pain, nausea, vomiting, diarrhea, constipation.    Objective:   Vitals:   03/16/17 1027 03/16/17 1100 03/16/17 1114 03/16/17 1257  BP: (!) 167/108  (!) 151/100 (!) 143/91  Pulse: 84  70 67  Resp: 18  14   Temp: 97.6 F (36.4 C) 98 F (36.7 C) 97.7 F (36.5 C)   TempSrc:      SpO2: 100%  100%   Weight:      Height:        Intake/Output Summary (Last 24 hours) at 03/16/17 1430 Last data filed at 03/16/17 1028  Gross per 24 hour  Intake              940 ml  Output               10 ml  Net              930 ml   Filed Weights   03/15/17 1937 03/16/17 0044  Weight: 87.5 kg (193 lb) 89.6 kg (197 lb 8.5 oz)    Exam  General: Well developed, well nourished, NAD, appears stated age  HEENT: NCAT, mucous membranes moist.   Cardiovascular: S1 S2 auscultated, no rubs, murmurs or gallops. Regular rate and rhythm.  Respiratory: Clear to auscultation bilaterally with equal chest rise  Abdomen: Soft, nontender, nondistended, + bowel sounds  GU: enlarged scrotum  Extremities: warm dry without cyanosis clubbing or edema  Neuro: AAOx3, nonfocal   Psych: Normal affect and demeanor with intact judgement and insight   Data Reviewed: I have personally reviewed following labs and imaging studies  CBC:  Recent Labs Lab 03/15/17 2116 03/16/17 0439  WBC 15.7* 13.9*  NEUTROABS 11.5*  --   HGB 12.8* 13.0  HCT 39.0 39.5  MCV 89.7 87.8  PLT 329 330   Basic Metabolic Panel:  Recent Labs Lab 03/15/17 2116 03/16/17 0439  NA 136 134*  K 3.6 3.8  CL 103 101  CO2 25 27  GLUCOSE 96 106*  BUN 7 7  CREATININE 0.82 0.95  CALCIUM 9.4 9.0   GFR: Estimated Creatinine Clearance: 120.3 mL/min (by C-G formula based on SCr of 0.95 mg/dL). Liver Function Tests: No results for input(s): AST, ALT, ALKPHOS, BILITOT, PROT, ALBUMIN in the  last 168 hours. No results for input(s): LIPASE, AMYLASE in the last 168 hours. No results for input(s): AMMONIA in the last 168 hours. Coagulation Profile: No results for input(s): INR, PROTIME in the last 168 hours. Cardiac Enzymes: No results for input(s): CKTOTAL, CKMB, CKMBINDEX, TROPONINI in the last 168 hours. BNP (last 3 results) No results for input(s): PROBNP in the last 8760 hours. HbA1C: No results for input(s): HGBA1C in the last 72 hours. CBG: No results for input(s): GLUCAP in the last 168 hours. Lipid Profile: No results for input(s): CHOL, HDL, LDLCALC, TRIG, CHOLHDL, LDLDIRECT in the last 72 hours. Thyroid Function Tests: No results for input(s): TSH, T4TOTAL, FREET4, T3FREE, THYROIDAB in the last 72 hours. Anemia Panel: No results for input(s): VITAMINB12, FOLATE, FERRITIN, TIBC, IRON, RETICCTPCT in the last 72 hours. Urine analysis:    Component Value Date/Time   COLORURINE AMBER (A) 03/15/2017 2120   APPEARANCEUR HAZY (A) 03/15/2017 2120   LABSPEC 1.027 03/15/2017 2120   PHURINE 5.0 03/15/2017 2120   GLUCOSEU NEGATIVE 03/15/2017 2120   HGBUR SMALL (A) 03/15/2017 2120   BILIRUBINUR NEGATIVE 03/15/2017 2120   BILIRUBINUR neg 03/15/2017 1446   KETONESUR 20 (A) 03/15/2017 2120   PROTEINUR 30 (A) 03/15/2017 2120   UROBILINOGEN negative (A) 03/15/2017 1446   UROBILINOGEN 1.0 01/11/2015 0720   NITRITE NEGATIVE 03/15/2017 2120   LEUKOCYTESUR NEGATIVE 03/15/2017 2120   Sepsis Labs: @LABRCNTIP (procalcitonin:4,lacticidven:4)  ) Recent Results (from the past 240 hour(s))  Surgical PCR screen     Status: None   Collection Time: 03/16/17  8:44 AM  Result Value Ref Range Status   MRSA, PCR NEGATIVE NEGATIVE Final   Staphylococcus aureus NEGATIVE NEGATIVE Final    Comment:        The Xpert SA Assay (FDA approved for NASAL specimens in patients over 4 years of age), is one component of a comprehensive surveillance program.  Test performance has been  validated by [facility] for patients greater than or equal to 4 year old. It is not intended to diagnose infection nor to guide or monitor treatment.       Radiology Studies: US Scrotum  Result Date: 03/15/2017 CLINICAL DATA:  Testicular pain and swelling despite treating with antibiotics last month. No relief in symptoms. EXAM: SCROTAL ULTRASOUND DOPPLER ULTRASOUND OF THE TESTICLES TECHNIQUE: Complete ultrasound examination of the testicles, epididymis, and other scrotal structures was performed. Color and spectral Doppler ultrasound were also utilized to evaluate blood flow to the testicles. COMPARISON:  None. FINDINGS: Right testicle Measurements: 4.2 x 2.7 x 2.6 cm, previously 4.4 x 2.4 x 2.4 cm. No mass or microlithiasis  visualized. Left testicle Measurements: 5 x 3 x 3.2 cm, previously 4.3 x 3.1 x 3 cm. The testicular parenchyma is now enlarged, heterogeneous and with areas of internal hypoechogenicity concerning for intratesticular abscesses. Right epididymis:  Normal in size and appearance. Left epididymis: Edematous and heterogeneous in appearance also with areas of hypoechogenicity noted within concerning for abscess and epididymitis. Hydrocele: None visualized on the right. Concern for small left-sided pyocele given heterogeneous septated appearing fluid. Varicocele:  None visualized. Pulsed Doppler interrogation of both testes demonstrates normal low resistance arterial and venous waveforms bilaterally. IMPRESSION: 1. Interval enlargement of the left testicle and epididymis with internal areas of hypoechogenicity concerning for development of intra testicular and epididymal abscesses. Complex fluid is also suggested about the testicle and epididymis on the left and a small pyocele is not excluded. 2. No torsion is identified. Electronically Signed   By: Tollie Ethavid  Kwon M.D.   On: 03/15/2017 21:25   [place] Art/ven Flow Abd Pelv Doppler  Result Date: 03/15/2017 CLINICAL DATA:  Testicular pain and  swelling despite treating with antibiotics last month. No relief in symptoms. EXAM: SCROTAL ULTRASOUND DOPPLER ULTRASOUND OF THE TESTICLES TECHNIQUE: Complete ultrasound examination of the testicles, epididymis, and other scrotal structures was performed. Color and spectral Doppler ultrasound were also utilized to evaluate blood flow to the testicles. COMPARISON:  None. FINDINGS: Right testicle Measurements: 4.2 x 2.7 x 2.6 cm, previously 4.4 x 2.4 x 2.4 cm. No mass or microlithiasis visualized. Left testicle Measurements: 5 x 3 x 3.2 cm, previously 4.3 x 3.1 x 3 cm. The testicular parenchyma is now enlarged, heterogeneous and with areas of internal hypoechogenicity concerning for intratesticular abscesses. Right epididymis:  Normal in size and appearance. Left epididymis: Edematous and heterogeneous in appearance also with areas of hypoechogenicity noted within concerning for abscess and epididymitis. Hydrocele: None visualized on the right. Concern for small left-sided pyocele given heterogeneous septated appearing fluid. Varicocele:  None visualized. Pulsed Doppler interrogation of both testes demonstrates normal low resistance arterial and venous waveforms bilaterally. IMPRESSION: 1. Interval enlargement of the left testicle and epididymis with internal areas of hypoechogenicity concerning for development of intra testicular and epididymal abscesses. Complex fluid is also suggested about the testicle and epididymis on the left and a small pyocele is not excluded. 2. No torsion is identified. Electronically Signed   By: Tollie Ethavid  Kwon M.D.   On: 03/15/2017 21:25     Scheduled Meds: . enoxaparin (LOVENOX) injection  40 mg Subcutaneous QHS   Continuous Infusions: . cefTRIAXone (ROCEPHIN)  IV       LOS: 1 day   Time Spent in minutes   30 minutes  Toree Edling D.O. on 03/16/2017 at 2:30 PM  Between 7am to 7pm - Pager - [phone]  After 7pm go to [email or web address] - password [email or web address]  And look for the  night coverage person covering for me after hours  [facility] Office  [phone] \

## 2017-03-16 NOTE — Transfer of Care (Signed)
Immediate Anesthesia Transfer of Care Note  Patient: Aaron JunglingJonathan E Williamson  Procedure(s) Performed: Procedure(s): IRRIGATION AND DEBRIDEMENT SCROTUM, LEFT ORCHIECTOMY (N/A)  Patient Location: PACU  Anesthesia Type:General  Level of Consciousness:  sedated, patient cooperative and responds to stimulation  Airway & Oxygen Therapy:Patient Spontanous Breathing and Patient connected to face mask oxgen  Post-op Assessment:  Report given to PACU RN and Post -op Vital signs reviewed and stable  Post vital signs:  Reviewed and stable  Last Vitals:  Vitals:   03/16/17 0044 03/16/17 0525  BP: (!) 151/91 (!) 147/88  Pulse: 73 69  Resp: 18 16  Temp: 36.5 C 36.4 C    Complications: No apparent anesthesia complications

## 2017-03-16 NOTE — Consult Note (Signed)
Urology Consult  CC: Referring physician: Lyda Perone, MD Reason for referral: Scrotal abscess  Impression/Assessment: 1. Scrotal abscess: He appears to have an abscess of the scrotal wall. This was not appreciated on ultrasound however it appears to have originated from an epididymitis. I have discussed with the patient the need for drainage of the scrotal abscess surgically and that I will also explore the scrotum and left testicle. With the ultrasound findings it may be that his testicle and epididymis can be salvaged with antibiotic therapy alone although I told him there was a high probability that he would need to have a left orchiectomy. We have discussed the procedure in detail including its risks and competitions, the alternatives, the probability of success as well as the anticipated postoperative course. He understands and has elected to proceed. We did discuss that once his infection has cleared and he is completely healed in the future he could potentially undergo placement of a testicular prosthesis.   Plan:  Scrotal exploration, drainage of scrotal skin abscess and possible left orchiectomy    History of Present Illness:  He was seen in the emergency room on 02/21/17 complaining of intermittent left testicular pain that had begun 5 days prior to his presentation.  3 days prior to presenting to the ER he said he began to note some swelling of the left hemiscrotum with some referred pain to the left lower back.  He denied trauma and had tried ibuprofen and Tylenol with no improvement.  He was found at that time by exam to have some left epididymal enlargement and tenderness consistent with left epididymitis.  A scrotal ultrasound was performed that revealed no evidence of abscess and was consistent with acute left epididymitis.  He was scratched that he received a dose of Rocephin was placed on appropriate antibiotic therapy using doxycycline.  He presented to the emergency room again on  03/15/17 having noted little improvement in the swelling with some increasing pain especially with direct pressure.  His urine was found to be free of any sign of infection with a slight elevation of his white blood cell count of 15.7.  A repeat scrotal ultrasound was performed which revealed a heterogeneous left testicle with a pattern suggesting possible small intratesticular abscesses.  The left epididymis was noted to be heterogeneous as well.  Both testicles had good blood flow and it was felt there could be intratesticular abscesses present but there was no evidence of abscess of the scrotal skin. He notes that the pain is not severe and has improved slightly. He has no risk factors such as diabetes or HIV.   Past Medical History:  Diagnosis Date  . Allergy    History reviewed. No pertinent surgical history.  Medications:  Scheduled: . enoxaparin (LOVENOX) injection  40 mg Subcutaneous QHS   Continuous: . cefTRIAXone (ROCEPHIN)  IV      Allergies:  Allergies  Allergen Reactions  . Fish Allergy Swelling    Family History  Problem Relation Age of Onset  . Diabetes Mother   . Arthritis Mother   . Hypertension Sister   . Diabetes Sister     Social History:  reports that he has never smoked. He has never used smokeless tobacco. He reports that he drinks alcohol. He reports that he does not use drugs.  Review of Systems (10 point): Pertinent items are noted in HPI. A comprehensive review of systems was negative except as noted above.  Physical Exam:  Vital signs in last 24 hours:  Temp:  [97.5 F (36.4 C)-98.1 F (36.7 C)] 97.5 F (36.4 C) (05/05 0525) Pulse Rate:  [69-96] 69 (05/05 0525) Resp:  [16-18] 16 (05/05 0525) BP: (136-159)/(79-99) 147/88 (05/05 0525) SpO2:  [96 %-100 %] 100 % (05/05 0525) Weight:  [193 lb (87.5 kg)-197 lb 8.5 oz (89.6 kg)] 197 lb 8.5 oz (89.6 kg) (05/05 0044) General appearance: alert and appears stated age Head: Normocephalic, without obvious  abnormality, atraumatic Eyes: conjunctivae/corneas clear. EOM's intact.  Oropharynx: moist mucous membranes Neck: supple, symmetrical, trachea midline Resp: normal respiratory effort Cardio: regular rate and rhythm Back: symmetric, no curvature. ROM normal. No CVA tenderness. GI: soft, non-tender; bowel sounds normal; no masses,  no organomegaly Male genitalia: penis: normal male phallus with no lesions or discharge. Scrotum: The skin overlying the left hemiscrotum seems to have some fluctuance. The remainder of the scrotum is indurated. There is no evidence of crepitus. Extremities: extremities normal, atraumatic, no cyanosis or edema Skin: Skin color normal. No visible rashes or lesions Neurologic: Grossly normal  Laboratory Data:   Recent Labs  03/15/17 2116 03/16/17 0439  WBC 15.7* 13.9*  HGB 12.8* 13.0  HCT 39.0 39.5   BMET  Recent Labs  03/15/17 2116 03/16/17 0439  NA 136 134*  K 3.6 3.8  CL 103 101  CO2 25 27  GLUCOSE 96 106*  BUN 7 7  CREATININE 0.82 0.95  CALCIUM 9.4 9.0   No results for input(s): LABPT, INR in the last 72 hours. No results for input(s): LABURIN in the last 72 hours. Results for orders placed or performed during the hospital encounter of 07/21/09  Urine culture     Status: None   Collection Time: 07/21/09  9:50 PM  Result Value Ref Range Status   Specimen Description URINE, CLEAN CATCH  Final   Special Requests NONE  Final   Colony Count NO GROWTH  Final   Culture NO GROWTH  Final   Report Status 07/23/2009 FINAL  Final   Creatinine:  Recent Labs  03/15/17 2116 03/16/17 0439  CREATININE 0.82 0.95    Imaging: [place] Scrotum  Result Date: 03/15/2017 CLINICAL DATA:  Testicular pain and swelling despite treating with antibiotics last month. No relief in symptoms. EXAM: SCROTAL ULTRASOUND DOPPLER ULTRASOUND OF THE TESTICLES TECHNIQUE: Complete ultrasound examination of the testicles, epididymis, and other scrotal structures was performed.  Color and spectral Doppler ultrasound were also utilized to evaluate blood flow to the testicles. COMPARISON:  None. FINDINGS: Right testicle Measurements: 4.2 x 2.7 x 2.6 cm, previously 4.4 x 2.4 x 2.4 cm. No mass or microlithiasis visualized. Left testicle Measurements: 5 x 3 x 3.2 cm, previously 4.3 x 3.1 x 3 cm. The testicular parenchyma is now enlarged, heterogeneous and with areas of internal hypoechogenicity concerning for intratesticular abscesses. Right epididymis:  Normal in size and appearance. Left epididymis: Edematous and heterogeneous in appearance also with areas of hypoechogenicity noted within concerning for abscess and epididymitis. Hydrocele: None visualized on the right. Concern for small left-sided pyocele given heterogeneous septated appearing fluid. Varicocele:  None visualized. Pulsed Doppler interrogation of both testes demonstrates normal low resistance arterial and venous waveforms bilaterally. IMPRESSION: 1. Interval enlargement of the left testicle and epididymis with internal areas of hypoechogenicity concerning for development of intra testicular and epididymal abscesses. Complex fluid is also suggested about the testicle and epididymis on the left and a small pyocele is not excluded. 2. No torsion is identified. Electronically Signed   By: Tollie Ethavid  Kwon M.D.   On:  03/15/2017 21:25   [place] Art/ven Flow Abd Pelv Doppler  Result Date: 03/15/2017 CLINICAL DATA:  Testicular pain and swelling despite treating with antibiotics last month. No relief in symptoms. EXAM: SCROTAL ULTRASOUND DOPPLER ULTRASOUND OF THE TESTICLES TECHNIQUE: Complete ultrasound examination of the testicles, epididymis, and other scrotal structures was performed. Color and spectral Doppler ultrasound were also utilized to evaluate blood flow to the testicles. COMPARISON:  None. FINDINGS: Right testicle Measurements: 4.2 x 2.7 x 2.6 cm, previously 4.4 x 2.4 x 2.4 cm. No mass or microlithiasis visualized. Left testicle  Measurements: 5 x 3 x 3.2 cm, previously 4.3 x 3.1 x 3 cm. The testicular parenchyma is now enlarged, heterogeneous and with areas of internal hypoechogenicity concerning for intratesticular abscesses. Right epididymis:  Normal in size and appearance. Left epididymis: Edematous and heterogeneous in appearance also with areas of hypoechogenicity noted within concerning for abscess and epididymitis. Hydrocele: None visualized on the right. Concern for small left-sided pyocele given heterogeneous septated appearing fluid. Varicocele:  None visualized. Pulsed Doppler interrogation of both testes demonstrates normal low resistance arterial and venous waveforms bilaterally. IMPRESSION: 1. Interval enlargement of the left testicle and epididymis with internal areas of hypoechogenicity concerning for development of intra testicular and epididymal abscesses. Complex fluid is also suggested about the testicle and epididymis on the left and a small pyocele is not excluded. 2. No torsion is identified. Electronically Signed   By: Tollie Eth M.D.   On: 03/15/2017 21:25       Kenyata Napier C 03/16/2017, 5:55 AM

## 2017-03-17 DIAGNOSIS — A419 Sepsis, unspecified organism: Secondary | ICD-10-CM

## 2017-03-17 DIAGNOSIS — N492 Inflammatory disorders of scrotum: Secondary | ICD-10-CM

## 2017-03-17 LAB — URINE CULTURE: CULTURE: NO GROWTH

## 2017-03-17 MED ORDER — CIPROFLOXACIN HCL 500 MG PO TABS: 500.0000 mg | ORAL_TABLET | Freq: Two times a day (BID) | ORAL | 0 refills | Status: DC

## 2017-03-17 MED ORDER — HYDROCODONE-ACETAMINOPHEN 5-325 MG PO TABS: 1.0000 | ORAL_TABLET | ORAL | 0 refills | Status: DC | PRN

## 2017-03-17 NOTE — Discharge Instructions (Signed)
Orchiectomy, Care After °This sheet gives you information about how to care for yourself after your procedure. Your health care provider may also give you more specific instructions. If you have problems or questions, contact your health care provider. °What can I expect after the procedure? °After the procedure, it is common to have: °· Pain. °· Bruising. °· Blood pooling (hematoma) in the area where your testicles were removed. °· Depression or mood changes. °· Fatigue. °· Hot flashes. ° °Follow these instructions at home: °Managing pain and swelling °· If directed, put ice on the affected area: °? Put ice in a plastic bag. °? Place a towel between your skin and the bag. °? Leave the ice on for 20 minutes, 2-3 times a day. °· Wear scrotal support as told by your health care provider. °· To relieve pressure and pain when sitting, you may use a donut cushion if directed by your health care provider. °Incision care °· Follow instructions from your health care provider about how to take care of your incision. Make sure you: °? Wash your hands with soap and water before you change your bandage (dressing). If soap and water are not available, use hand sanitizer. °? Change your dressing once a day, or as often as told by your health care provider. If the dressing sticks to your incision area: °? Use warm, soapy water or hydrogen peroxide to dampen the dressing. °? When the dressing becomes loose, lift it from the incision area. Make sure that the incision stays closed. °? Leave stitches (sutures), skin glue, or adhesive strips in place. These skin closures may need to stay in place for 2 weeks or longer. If adhesive strip edges start to loosen and curl up, you may trim the loose edges. Do not remove adhesive strips completely unless your health care provider tells you to do that. °· Keep your dressing dry until it has been removed. °· Check your incision area every day for signs of infection. Check for: °? More redness,  swelling, or pain. °? More fluid or blood. °? Warmth. °? Pus or a bad smell. °Bathing °· Do not take baths, swim, or use a hot tub until your health care provider approves. You may start taking showers two days after your procedure. °· Do not rub your incision to dry it. Pat the area gently with a clean cloth or let it air-dry. °Medicines °· Take over-the-counter and prescription medicines only as told by your health care provider. °· If you were prescribed an antibiotic medicine, use it as told by your health care provider. Do not stop using the antibiotic even if you start to feel better. °· If you had both testicles removed, talk with your health care provider about medicine supplements to replace one of the male hormones (testosterone) that your body will no longer make. °Driving °· Do not drive for 24 hours if you were given a medicine to help you relax (sedative). °· Do not drive or use heavy machinery while taking prescription pain medicines. °Activity °· Avoid activities that may cause your incision to open, such as jogging, playing sports, and straining with bowel movements. Ask your health care provider what activities are safe for you. °· Do not lift anything that is heavier than 10 lb (4.5 kg), or the limit that your health care provider tells you, until he or she says that it is safe. °· Do not engage in sexual activity until the area is healed and your health care provider approves.   This could take up to 4 weeks. °General instructions °· To prevent or treat constipation while you are taking prescription pain medicine, your health care provider may recommend that you: °? Drink enough fluid to keep your urine clear or pale yellow. °? Take over-the-counter or prescription medicines. °? Eat foods that are high in fiber, such as fresh fruits and vegetables, whole grains, and beans. °? Limit foods that are high in fat and processed sugars, such as fried and sweet foods. °· Do not use any products that  contain nicotine or tobacco, such as cigarettes and e-cigarettes. If you need help quitting, ask your health care provider. °· Keep all follow-up visits as told by your health care provider. This is important. °Contact a health care provider if: °· You have more pain, swelling, or redness in your genital or groin area. °· You have more fluid or blood coming from your incision. °· Your incision feels warm to the touch. °· You have pus or a bad smell coming from your incision. °· You have constipation that is not helped by changing your diet or drinking more fluid. °· You develop nausea or vomiting. °· You cannot eat or drink without vomiting. °Get help right away if: °· You have dizziness or nausea that does not go away. °· You have trouble breathing. °· You have a wet (congested) cough. °· You have a fever or shaking chills. °· Your incision breaks open after the skin closures have been removed. °· You are not able to urinate. °Summary °· After this procedure, it is most common to have bruising or blood pooling in the area where the testicles were removed. °· You should check your incision area every day for signs of infection, such as redness, swelling, pain, fluid, blood, warmth, pus, or a bad smell. °· Avoid activities that may cause your incision to open, such as jogging, playing sports, and straining with bowel movements. Ask your health care provider what activities are safe for you. °· You should not engage in sexual activity until the area is healed and your health care provider approves. This could take up to 4 weeks. °· Men who have both testicles removed may have emotional and physical side effects. Your health care provider can help you with ways to manage those side effects. °This information is not intended to replace advice given to you by your health care provider. Make sure you discuss any questions you have with your health care provider. °Document Released: 07/01/2013 Document Revised: 09/20/2016  Document Reviewed: 09/20/2016 °Elsevier Interactive Patient Education © 2017 [employer] ° °

## 2017-03-17 NOTE — Final Consult Note (Signed)
1 Day Post-Op Subjective: The patient is doing well.  No nausea or vomiting. Pain is much better.  Objective: Vital signs in last 24 hours: Temp:  [97.5 F (36.4 C)-98.1 F (36.7 C)] 97.5 F (36.4 C) (05/06 0648) Pulse Rate:  [67-84] 84 (05/06 0648) Resp:  [14-18] 16 (05/06 0648) BP: (137-167)/(76-108) 147/90 (05/06 0648) SpO2:  [99 %-100 %] 99 % (05/06 0648)  Intake/Output from previous day: 05/05 0701 - 05/06 0700 In: 400 [I.V.:400] Out: 1360 [Urine:1350; Blood:10] Intake/Output this shift: No intake/output data recorded.  Physical Exam:  General: Alert and oriented. CV: RRR Lungs: Clear bilaterally. GI: Soft, Nondistended.    Lab Results:  Recent Labs  03/15/17 2116 03/16/17 0439  HGB 12.8* 13.0  HCT 39.0 39.5    Assessment/Plan: POD# 1 s/p Drainage of scrotal abscess and left orchiectomy - he is doing well and feeling much better. The scrotum is healing with some slight serosanguineous drainage but no sign of infection. Gram stain revealed some rods and coccobacillus with culture and sensitivity results pending. I would recommend empiric antibiotics by mouth on discharge using Cipro 500 mg twice a day and I will follow-up with his culture and make any antibiotics adjustments as necessary. I have discussed wound care with him in detail and how to advance his Penrose drain at home.  He is felt ready for discharge from urologic standpoint at this time. Follow-up information has been placed on the chart.

## 2017-03-17 NOTE — Progress Notes (Signed)
Patient discharged to home with family, discharge instructions reviewed with patient who verbalized understanding. Dressing change instructions reviewed with patient.New RX & work note given to patient.

## 2017-03-17 NOTE — Discharge Summary (Signed)
Physician Discharge Summary  Aaron Williamson NFA:[id number] DOB: 1981-04-03 DOA: 03/15/2017  PCP: Jac Canavan, PA-C  Admit date: 03/15/2017 Discharge date: 03/17/2017  Time spent: 30 minutes  Recommendations for Outpatient Follow-up:  Patient will be discharged to home.  Patient will need to follow up with primary care provider within one week of discharge. Follow up with urology.  Patient should continue medications as prescribed.  Patient should follow a regular diet.   Discharge Diagnoses:  Sepsis secondary to scrotal abcess   Discharge Condition: Stable  Diet recommendation: Regular  Filed Weights   03/15/17 1937 03/16/17 0044  Weight: 87.5 kg (193 lb) 89.6 kg (197 lb 8.5 oz)    History of present illness:  on 03/15/2017 by Dr. Hale Drone Summersis a 35 y.o.malewith medical history significant of previously being seen in ED on 4/12 for pain swelling of testicle. Treated with rocephin and doxy initially. Had a little improvement but symptoms have persisted and ultimately worsened.  Hospital Course:  Sepsis secondary to Scrotal abscess -This is an ongoing issue since April 2018, patient has been on antibiotics -Up on admission, patient was noted to have some tachycardia with leukocytosis -Vital signs improving, leukocytosis also improving -Scrotal ultrasound showed interval enlargement of the left testicle and epididymis with internal areas of hypoechogenicity concerning for intratesticular and epididymal abscesses -Urology consult and appreciated, s/p scrotal exploration with drainage of scrotal skin abscess, left orchietomy  -Was placed onceftriaxone and pain control -Abscess gram stain shows rare GNR, GPCoccobacillus, culture pending  -Discussed with Dr. Vernie Ammons, urology, will discharge patient with 7 days of Cipro 500mg  BID. Dr. Vernie Ammons will monitor scortal abscess culture and make modifications to antibiotics as needed. Patient will need to follow up  with urology and at that time pathology results can be discussed. -Work note given to patient  -Pain meds prescribed  Procedures: Scrotal US Scrotal exploration with drainage of scrotal skin abscess, left orchietomy   Consultations: Urology  Discharge Exam: Vitals:   03/16/17 2103 03/17/17 0648  BP: 137/76 (!) 147/90  Pulse: 80 84  Resp: 16 16  Temp: 98.1 F (36.7 C) 97.5 F (36.4 C)   Patient feels his pain as minimal at this time, but increases when he walks. Denies chest pain, shortness of breath, abdominal pain, N/V/D/C, dizziness, headache.   General: Well developed, well nourished, NAD, appears stated age  HEENT: NCAT, mucous membranes moist.  Cardiovascular: S1 S2 auscultated, no rubs, murmurs or gallops. Regular rate and rhythm.  Respiratory: Clear to auscultation bilaterally with equal chest rise  Abdomen: Soft, nontender, nondistended, + bowel sounds  GU: scrotal incision clean, mild serosanguineous drainage  Extremities: warm dry without cyanosis clubbing or edema  Neuro: AAOx3, nonfocal  Psych: Normal affect and demeanor with intact judgement and insight  Discharge Instructions Discharge Instructions    Discharge instructions    Complete by:  As directed    Patient will be discharged to home.  Patient will need to follow up with primary care provider within one week of discharge. Follow up with urology.  Patient should continue medications as prescribed.  Patient should follow a regular diet.     Current Discharge Medication List    START taking these medications   Details  ciprofloxacin (CIPRO) 500 MG tablet Take 1 tablet (500 mg total) by mouth 2 (two) times daily. Qty: 14 tablet, Refills: 0    HYDROcodone-acetaminophen (NORCO/VICODIN) 5-325 MG tablet Take 1 tablet by mouth every 4 (four) hours as needed for  moderate pain. Qty: 30 tablet, Refills: 0      CONTINUE these medications which have NOT CHANGED   Details  ibuprofen (ADVIL,MOTRIN)  200 MG tablet Take 200-400 mg by mouth every 6 (six) hours as needed for moderate pain.        Allergies  Allergen Reactions  . Fish Allergy Swelling   Follow-up Information    [facility], [facility] Follow up.   Why:  For an appointment in 1 week, for a wound check, when you get home. Contact information: [address]  [place] [address] [place] [address] [phone]        Tysinger, Kermit Baloavid S, PA-C. Schedule an appointment as soon as possible for a visit in 1 week(s).   Specialty:  Family Medicine Why:  Hospital follow up Contact information: [address] [place] [address] [phone]            The results of significant diagnostics from this hospitalization (including imaging, microbiology, ancillary and laboratory) are listed below for reference.    Significant Diagnostic Studies: [place] Scrotum  Result Date: 03/15/2017 CLINICAL DATA:  Testicular pain and swelling despite treating with antibiotics last month. No relief in symptoms. EXAM: SCROTAL ULTRASOUND DOPPLER ULTRASOUND OF THE TESTICLES TECHNIQUE: Complete ultrasound examination of the testicles, epididymis, and other scrotal structures was performed. Color and spectral Doppler ultrasound were also utilized to evaluate blood flow to the testicles. COMPARISON:  None. FINDINGS: Right testicle Measurements: 4.2 x 2.7 x 2.6 cm, previously 4.4 x 2.4 x 2.4 cm. No mass or microlithiasis visualized. Left testicle Measurements: 5 x 3 x 3.2 cm, previously 4.3 x 3.1 x 3 cm. The testicular parenchyma is now enlarged, heterogeneous and with areas of internal hypoechogenicity concerning for intratesticular abscesses. Right epididymis:  Normal in size and appearance. Left epididymis: Edematous and heterogeneous in appearance also with areas of hypoechogenicity noted within concerning for abscess and epididymitis. Hydrocele: None visualized on the right. Concern for small left-sided pyocele given heterogeneous septated appearing  fluid. Varicocele:  None visualized. Pulsed Doppler interrogation of both testes demonstrates normal low resistance arterial and venous waveforms bilaterally. IMPRESSION: 1. Interval enlargement of the left testicle and epididymis with internal areas of hypoechogenicity concerning for development of intra testicular and epididymal abscesses. Complex fluid is also suggested about the testicle and epididymis on the left and a small pyocele is not excluded. 2. No torsion is identified. Electronically Signed   By: Tollie Ethavid  Kwon M.D.   On: 03/15/2017 21:25   [place] Scrotum  Result Date: 02/22/2017 CLINICAL DATA:  Initial evaluation for intermittent left testicular pain and swelling for 3-5 days. EXAM: ULTRASOUND OF SCROTUM TECHNIQUE: Complete ultrasound examination of the testicles, epididymis, and other scrotal structures was performed. COMPARISON:  None. FINDINGS: Right testicle Measurements: 4.4 x 2.4 x 2.4 cm. No mass or microlithiasis visualized. Left testicle Measurements: 4.3 x 3.1 x 3.0 cm. No mass or microlithiasis visualized. Right epididymis:  Normal in size and appearance. Left epididymis: Left epididymis is asymmetrically enlarged, heterogeneous in appearance, with relatively increased vascularity. Finding suggestive of acute left epididymitis. Hydrocele:  Small left hydrocele, likely reactive. Varicocele:  None visualized. IMPRESSION: 1. Findings suggestive of acute left epididymitis as above. 2. Small left hydrocele, likely reactive. Electronically Signed   By: Rise MuBenjamin  McClintock M.D.   On: 02/22/2017 01:21   [place] Art/ven Flow Abd Pelv Doppler  Result Date: 03/15/2017 CLINICAL DATA:  Testicular pain and swelling despite treating with antibiotics last month. No relief in symptoms. EXAM: SCROTAL  ULTRASOUND DOPPLER ULTRASOUND OF THE TESTICLES TECHNIQUE: Complete ultrasound examination of the testicles, epididymis, and other scrotal structures was performed. Color and spectral Doppler ultrasound were also  utilized to evaluate blood flow to the testicles. COMPARISON:  None. FINDINGS: Right testicle Measurements: 4.2 x 2.7 x 2.6 cm, previously 4.4 x 2.4 x 2.4 cm. No mass or microlithiasis visualized. Left testicle Measurements: 5 x 3 x 3.2 cm, previously 4.3 x 3.1 x 3 cm. The testicular parenchyma is now enlarged, heterogeneous and with areas of internal hypoechogenicity concerning for intratesticular abscesses. Right epididymis:  Normal in size and appearance. Left epididymis: Edematous and heterogeneous in appearance also with areas of hypoechogenicity noted within concerning for abscess and epididymitis. Hydrocele: None visualized on the right. Concern for small left-sided pyocele given heterogeneous septated appearing fluid. Varicocele:  None visualized. Pulsed Doppler interrogation of both testes demonstrates normal low resistance arterial and venous waveforms bilaterally. IMPRESSION: 1. Interval enlargement of the left testicle and epididymis with internal areas of hypoechogenicity concerning for development of intra testicular and epididymal abscesses. Complex fluid is also suggested about the testicle and epididymis on the left and a small pyocele is not excluded. 2. No torsion is identified. Electronically Signed   By: Tollie Eth M.D.   On: 03/15/2017 21:25   [place] Art/ven Flow Abd Pelv Doppler  Result Date: 02/22/2017 CLINICAL DATA:  Initial evaluation for intermittent left testicular pain and swelling for 3-5 days. EXAM: ULTRASOUND OF SCROTUM TECHNIQUE: Complete ultrasound examination of the testicles, epididymis, and other scrotal structures was performed. COMPARISON:  None. FINDINGS: Right testicle Measurements: 4.4 x 2.4 x 2.4 cm. No mass or microlithiasis visualized. Left testicle Measurements: 4.3 x 3.1 x 3.0 cm. No mass or microlithiasis visualized. Right epididymis:  Normal in size and appearance. Left epididymis: Left epididymis is asymmetrically enlarged, heterogeneous in appearance, with  relatively increased vascularity. Finding suggestive of acute left epididymitis. Hydrocele:  Small left hydrocele, likely reactive. Varicocele:  None visualized. IMPRESSION: 1. Findings suggestive of acute left epididymitis as above. 2. Small left hydrocele, likely reactive. Electronically Signed   By: Rise Mu M.D.   On: 02/22/2017 01:21    Microbiology: Recent Results (from the past 240 hour(s))  Urine culture     Status: None   Collection Time: 03/15/17  9:20 PM  Result Value Ref Range Status   Specimen Description URINE, CLEAN CATCH  Final   Special Requests NONE  Final   Culture   Final    NO GROWTH Performed at [facility], [address], [place], [place] [address]    Report Status 03/17/2017 FINAL  Final  Surgical PCR screen     Status: None   Collection Time: 03/16/17  8:44 AM  Result Value Ref Range Status   MRSA, PCR NEGATIVE NEGATIVE Final   Staphylococcus aureus NEGATIVE NEGATIVE Final    Comment:        The Xpert SA Assay (FDA approved for NASAL specimens in patients over 70 years of age), is one component of a comprehensive surveillance program.  Test performance has been validated by [facility] for patients greater than or equal to 10 year old. It is not intended to diagnose infection nor to guide or monitor treatment.   Aerobic/Anaerobic Culture (surgical/deep wound)     Status: None (Preliminary result)   Collection Time: 03/16/17  9:50 AM  Result Value Ref Range Status   Specimen Description ABSCESS SCROTUM  Final   Special Requests NONE  Final   Gram  Stain   Final    ABUNDANT WBC PRESENT, PREDOMINANTLY PMN RARE GRAM NEGATIVE RODS RARE GRAM POSITIVE COCCOBACILLUS Performed at [facility], [address] [place], [place] [address]    Culture FEW ESCHERICHIA COLI  Final   Report Status PENDING  Incomplete     Labs: Basic Metabolic Panel:  Recent Labs Lab 03/15/17 2116 03/16/17 0439  NA 136 134*  K 3.6 3.8  CL 103  101  CO2 25 27  GLUCOSE 96 106*  BUN 7 7  CREATININE 0.82 0.95  CALCIUM 9.4 9.0   Liver Function Tests: No results for input(s): AST, ALT, ALKPHOS, BILITOT, PROT, ALBUMIN in the last 168 hours. No results for input(s): LIPASE, AMYLASE in the last 168 hours. No results for input(s): AMMONIA in the last 168 hours. CBC:  Recent Labs Lab 03/15/17 2116 03/16/17 0439  WBC 15.7* 13.9*  NEUTROABS 11.5*  --   HGB 12.8* 13.0  HCT 39.0 39.5  MCV 89.7 87.8  PLT 329 330   Cardiac Enzymes: No results for input(s): CKTOTAL, CKMB, CKMBINDEX, TROPONINI in the last 168 hours. BNP: BNP (last 3 results) No results for input(s): BNP in the last 8760 hours.  ProBNP (last 3 results) No results for input(s): PROBNP in the last 8760 hours.  CBG: No results for input(s): GLUCAP in the last 168 hours.     SignedEdsel Petrin  [facility] 03/17/2017, 10:24 AM

## 2017-03-18 ENCOUNTER — Encounter (HOSPITAL_COMMUNITY): Payer: Self-pay | Admitting: Urology

## 2017-03-18 LAB — GC/CHLAMYDIA PROBE AMP (~~LOC~~) NOT AT ARMC
CHLAMYDIA, DNA PROBE: NEGATIVE
Neisseria Gonorrhea: NEGATIVE

## 2017-03-18 NOTE — Anesthesia Postprocedure Evaluation (Signed)
Anesthesia Post Note  Patient: Aaron Williamson  Procedure(s) Performed: Procedure(s) (LRB): IRRIGATION AND DEBRIDEMENT SCROTUM, LEFT ORCHIECTOMY (N/A)  Patient location during evaluation: PACU Anesthesia Type: General Level of consciousness: awake and alert and patient cooperative Pain management: pain level controlled Vital Signs Assessment: post-procedure vital signs reviewed and stable Respiratory status: spontaneous breathing and respiratory function stable Cardiovascular status: stable Anesthetic complications: no       Last Vitals:  Vitals:   03/16/17 2103 03/17/17 0648  BP: 137/76 (!) 147/90  Pulse: 80 84  Resp: 16 16  Temp: 36.7 C 36.4 C    Last Pain:  Vitals:   03/17/17 0745  TempSrc:   PainSc: 3                  Ellan Tess S

## 2017-03-21 LAB — AEROBIC/ANAEROBIC CULTURE W GRAM STAIN (SURGICAL/DEEP WOUND)

## 2017-03-22 ENCOUNTER — Encounter (HOSPITAL_COMMUNITY): Payer: Self-pay | Admitting: Urology

## 2017-03-26 ENCOUNTER — Ambulatory Visit (INDEPENDENT_AMBULATORY_CARE_PROVIDER_SITE_OTHER): Payer: Managed Care, Other (non HMO) | Admitting: Medical

## 2017-03-26 ENCOUNTER — Encounter: Payer: Self-pay | Admitting: Medical

## 2017-03-26 VITALS — BP 132/90 | HR 72 | Temp 98.2°F | Wt 190.4 lb

## 2017-03-26 DIAGNOSIS — N492 Inflammatory disorders of scrotum: Secondary | ICD-10-CM | POA: Diagnosis not present

## 2017-03-26 MED ORDER — HYDROCODONE-ACETAMINOPHEN 5-325 MG PO TABS: 1.0000 | ORAL_TABLET | ORAL | 0 refills | Status: DC | PRN

## 2017-03-26 MED ORDER — MUPIROCIN 2 % EX OINT: 1.0000 "application " | TOPICAL_OINTMENT | Freq: Three times a day (TID) | CUTANEOUS | 0 refills | Status: DC

## 2017-03-26 NOTE — Progress Notes (Signed)
Subjective:     Patient ID: Aaron Williamson, male   DOB: 01/03/1981, 36 y.o.   MRN: [id number]  HPI Here for f/u on scrotal abscess.   I saw him 10 days ago for same.  Last visit which was his first visit here we referred [facility] where he subsequently had IV antibiotics, and ultimately had surgery for I&D including orchiectomy of left testicle due to the severity of the infection.  He has now completed a course of Cipro, but still has some swelling and fullness in left scrotum, has suture in place, and some pain.  Using the hydrocodone still.   Using topical antibiotic ointment.   He was scheduled to see urology in f/u today, but there was some sort of misunderstanding on the phone this morning and he wasn't seen today.   He is here for further treatment recommendations.  He was written for light duty for work.   No fever, no lymphadenopathy, no hx/o diabetes or HIV.  No other aggravating or relieving factors.  No other complaint.  Past Medical History:  Diagnosis Date  . Allergy    No current outpatient prescriptions on file prior to visit.   No current facility-administered medications on file prior to visit.     Review of Systems As in subjective     Objective:   Physical Exam  BP 132/90   Pulse 72   Temp 98.2 F (36.8 C)   Wt 190 lb 6.4 oz (86.4 kg)   SpO2 98%   BMI 25.82 kg/m   General appearance: alert, no distress, WD/WN, pleasant AA male, afebrile GU: circumcised, left anterior scrotum with veridical linear surgical wound with interrupted suture present, but some seeping and some dehiscence of wound.   Left lower scrotum with fullness and slight tenderness.  otherwise nontender, and much less swollen scrotum than last visit.      Assessment:     Encounter Diagnosis  Name Primary?  . Scrotal abscess Yes       Plan:     Reviewed 03/15/17 ED visit and 03/17/17 discharge summary, notes, labs, surgery note and recommendations.   medications  reviewed.  I called and spoke to Dr. Vernie Ammonsttelin at [facility] given the findings . He will see him tomorrow at urology office for wound check and recommendations.  I did refill a short term supply of pain medication, ointment and gave some ABD pads and gauze for wound care as discussed as he has been doing.    He voiced understanding and agreement of plan  Aaron Williamson was seen today for hospitalization follow-up.  Diagnoses and all orders for this visit:  Scrotal abscess  Other orders -     HYDROcodone-acetaminophen (NORCO/VICODIN) 5-325 MG tablet; Take 1 tablet by mouth every 4 (four) hours as needed for moderate pain. -     Discontinue: mupirocin ointment (BACTROBAN) 2 %; Place 1 application into the nose 3 (three) times daily. -     mupirocin ointment (BACTROBAN) 2 %; Apply 1 application topically 3 (three) times daily.

## 2017-04-01 ENCOUNTER — Telehealth: Payer: Self-pay | Admitting: Medical

## 2017-04-01 NOTE — Telephone Encounter (Signed)
I reviewed the Urology note.   Call and ask if they removed the suture or is he suppose to have the suture removed?

## 2017-04-01 NOTE — Telephone Encounter (Signed)
He  Is good, and he was told that they come out on their on

## 2017-10-25 ENCOUNTER — Encounter: Payer: Self-pay | Admitting: Medical

## 2017-10-25 ENCOUNTER — Ambulatory Visit: Payer: Managed Care, Other (non HMO) | Admitting: Medical

## 2017-10-25 VITALS — BP 134/80 | HR 67 | Wt 198.4 lb

## 2017-10-25 DIAGNOSIS — M779 Enthesopathy, unspecified: Secondary | ICD-10-CM | POA: Diagnosis not present

## 2017-10-25 DIAGNOSIS — T22291A Burn of second degree of multiple sites of right shoulder and upper limb, except wrist and hand, initial encounter: Secondary | ICD-10-CM

## 2017-10-25 DIAGNOSIS — L989 Disorder of the skin and subcutaneous tissue, unspecified: Secondary | ICD-10-CM | POA: Diagnosis not present

## 2017-10-25 DIAGNOSIS — M25511 Pain in right shoulder: Secondary | ICD-10-CM | POA: Diagnosis not present

## 2017-10-25 HISTORY — DX: Burn of second degree of multiple sites of right shoulder and upper limb, except wrist and hand, initial encounter: T22.291A

## 2017-10-25 MED ORDER — NAPROXEN 500 MG PO TABS: 500.0000 mg | ORAL_TABLET | Freq: Two times a day (BID) | ORAL | 0 refills | Status: DC

## 2017-10-25 MED ORDER — SILVER SULFADIAZINE 1 % EX CREA: 1.0000 "application " | TOPICAL_CREAM | Freq: Every day | CUTANEOUS | 0 refills | Status: DC
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
## 2017-12-10 IMAGING — US US ART/VEN ABD/PELV/SCROTUM DOPPLER LTD
1 series · 13 of 25 positions shown · non-contrast
Comparison: None.

CLINICAL DATA: Testicular pain and swelling despite treating with
antibiotics last month. No relief in symptoms.

EXAM:
SCROTAL ULTRASOUND
DOPPLER ULTRASOUND OF THE TESTICLES
TECHNIQUE: Complete ultrasound examination of the testicles, epididymis, and
other scrotal structures was performed. Color and spectral Doppler
ultrasound were also utilized to evaluate blood flow to the
testicles.

[Series 1: us art/ven abd/pelv/scrotum doppler ltd · 0.12mm/px · 13 of 67 slices shown]
[im 1/67]
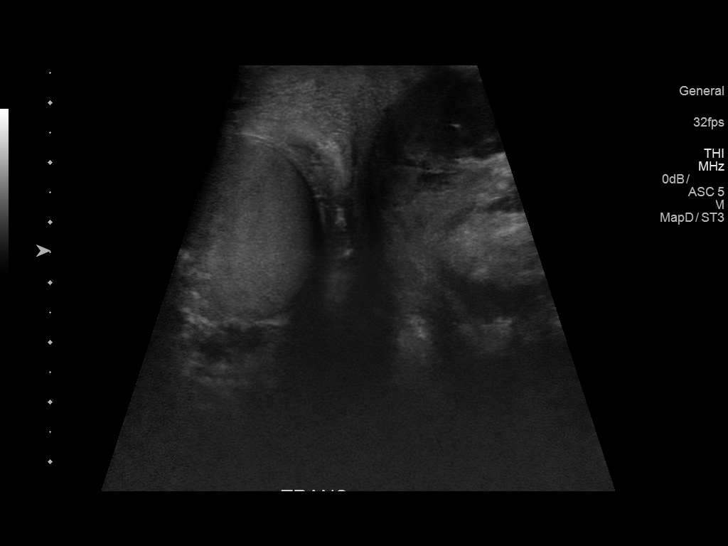
[im 6/67]
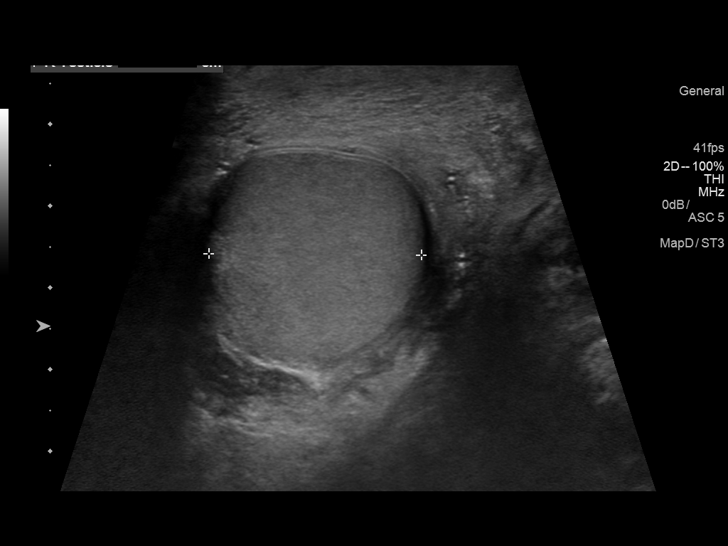
[im 12/67]
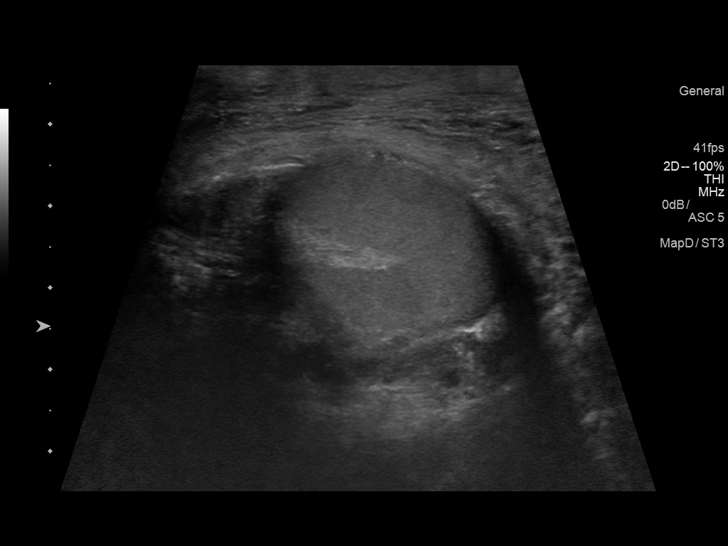
[im 17/67]
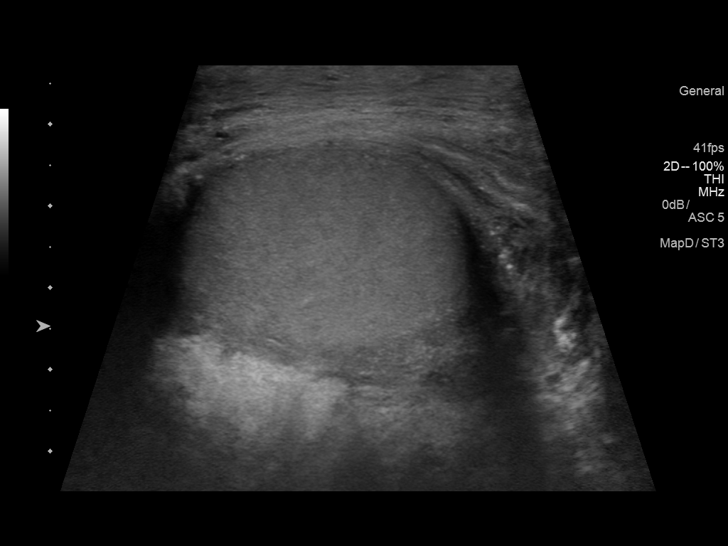
[im 23/67]
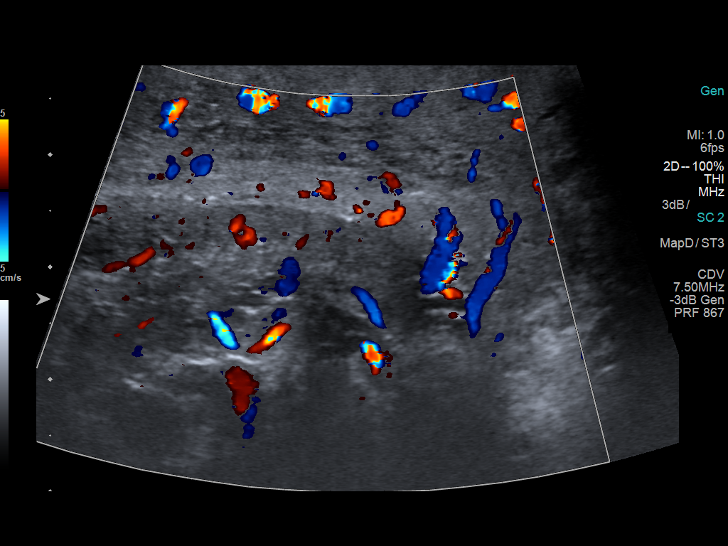
[im 28/67]
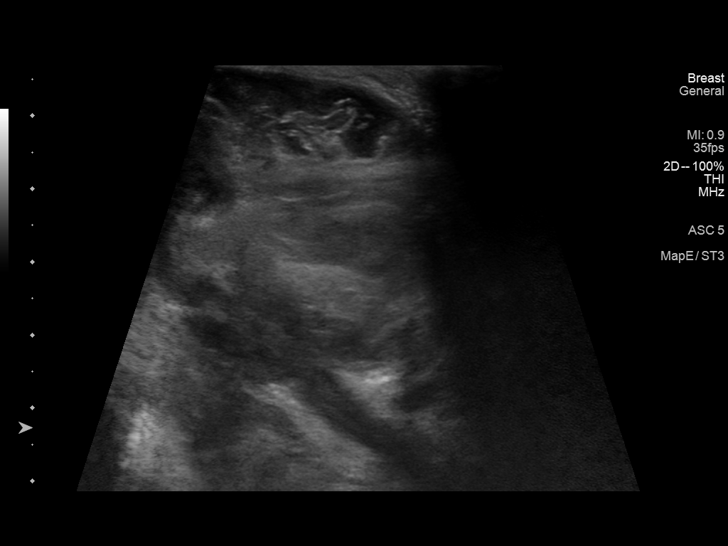
[im 34/67]
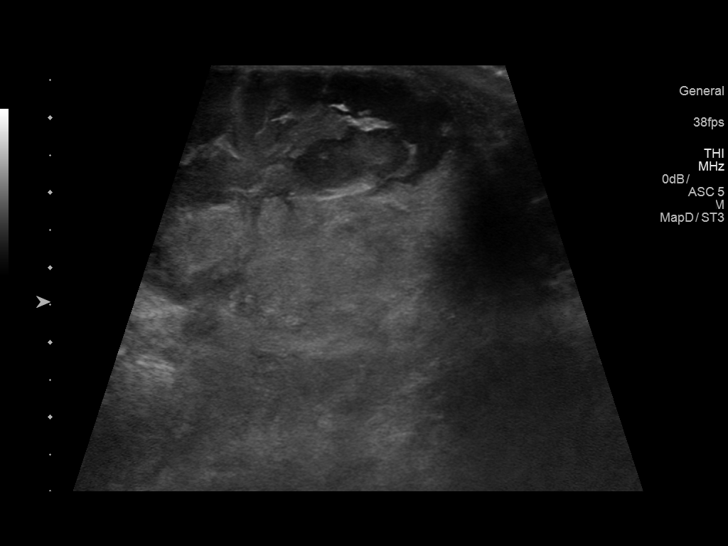
[im 39/67]
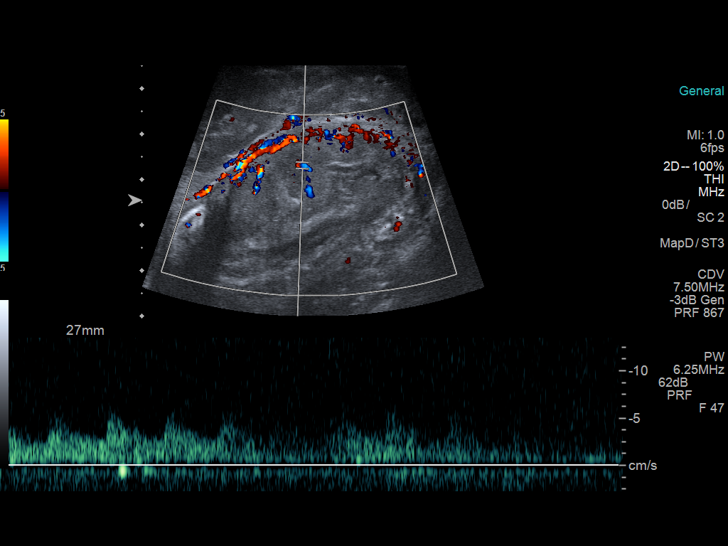
[im 45/67]
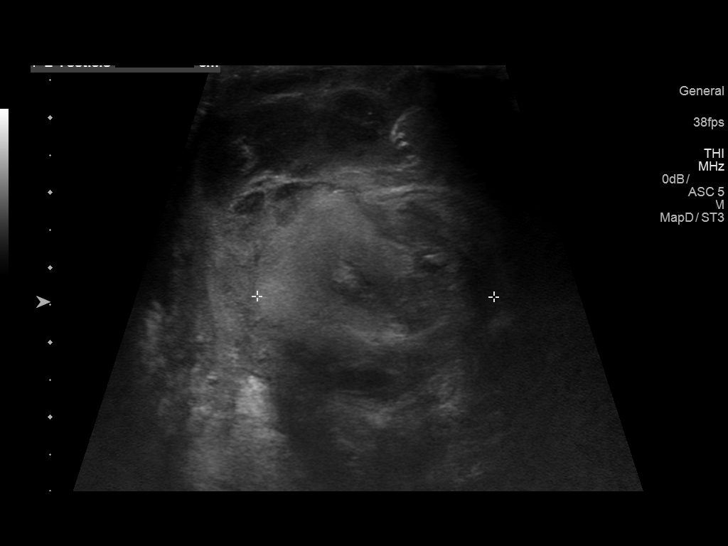
[im 50/67]
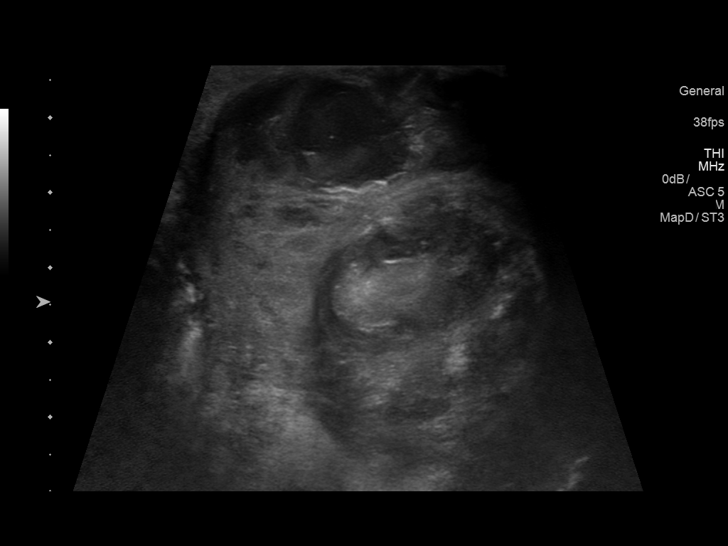
[im 56/67]
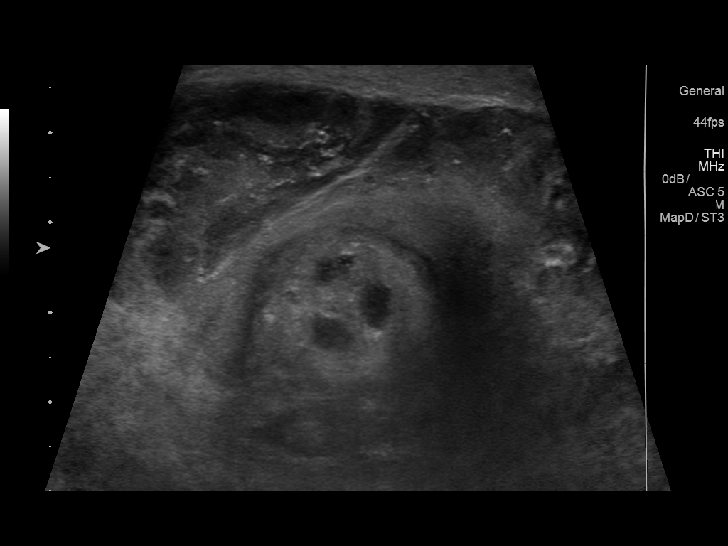
[im 61/67]
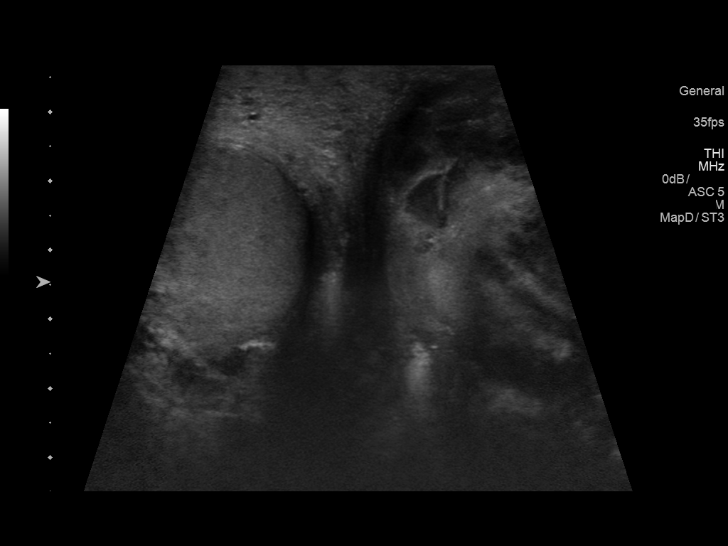
[im 67/67]
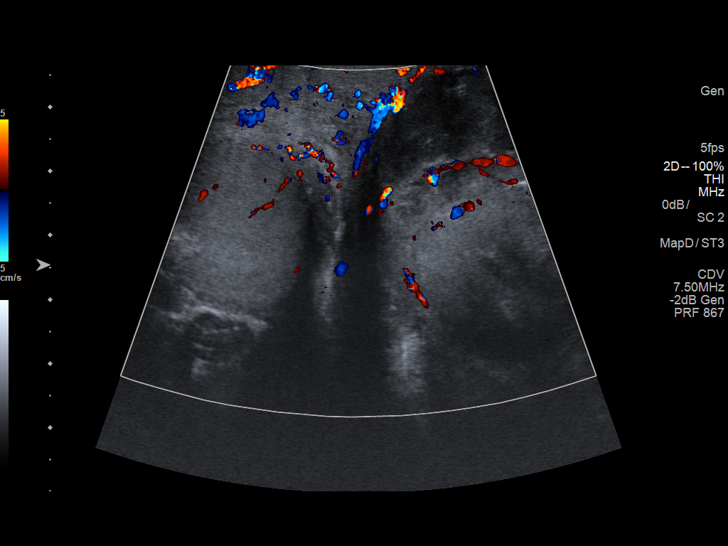

[13 of 25 positions shown; findings below may reference images not displayed]

FINDINGS: Right testicle

Measurements: 4.2 x 2.7 x 2.6 cm, previously 4.4 x 2.4 x 2.4 cm. No
mass or microlithiasis visualized.

Left testicle

Measurements: 5 x 3 x 3.2 cm, previously 4.3 x 3.1 x 3 cm. The
testicular parenchyma is now enlarged, heterogeneous and with areas
of internal hypoechogenicity concerning for intratesticular
abscesses.

Right epididymis:  Normal in size and appearance.

Left epididymis: Edematous and heterogeneous in appearance also with
areas of hypoechogenicity noted within concerning for abscess and
epididymitis.

Hydrocele: None visualized on the right. Concern for small
left-sided pyocele given heterogeneous septated appearing fluid.

Varicocele:  None visualized.

Pulsed Doppler interrogation of both testes demonstrates normal low
resistance arterial and venous waveforms bilaterally.
IMPRESSION: 1. Interval enlargement of the left testicle and epididymis with
internal areas of hypoechogenicity concerning for development of
intra testicular and epididymal abscesses. Complex fluid is also
suggested about the testicle and epididymis on the left and a small
pyocele is not excluded.
2. No torsion is identified.

## 2017-12-19 ENCOUNTER — Encounter (HOSPITAL_COMMUNITY): Payer: Self-pay | Admitting: Emergency Medicine

## 2017-12-19 ENCOUNTER — Emergency Department (HOSPITAL_COMMUNITY): Payer: Managed Care, Other (non HMO)

## 2017-12-19 ENCOUNTER — Other Ambulatory Visit: Payer: Self-pay

## 2017-12-19 DIAGNOSIS — M545 Low back pain: Secondary | ICD-10-CM | POA: Diagnosis not present

## 2017-12-19 DIAGNOSIS — Z5321 Procedure and treatment not carried out due to patient leaving prior to being seen by health care provider: Secondary | ICD-10-CM | POA: Diagnosis not present
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
## 2017-12-20 ENCOUNTER — Ambulatory Visit: Payer: Managed Care, Other (non HMO) | Admitting: Medical

## 2017-12-20 ENCOUNTER — Emergency Department (HOSPITAL_COMMUNITY)
Admission: EM | Admit: 2017-12-20 | Discharge: 2017-12-20 | Disposition: A | Discharge status: Home / Self Care | Payer: Managed Care, Other (non HMO) | Attending: Emergency Medicine | Admitting: Emergency Medicine

## 2017-12-20 ENCOUNTER — Encounter: Payer: Self-pay | Admitting: Medical

## 2017-12-20 VITALS — BP 116/80 | HR 68 | Wt 193.0 lb

## 2017-12-20 DIAGNOSIS — M542 Cervicalgia: Secondary | ICD-10-CM | POA: Diagnosis not present

## 2017-12-20 DIAGNOSIS — M549 Dorsalgia, unspecified: Secondary | ICD-10-CM | POA: Insufficient documentation

## 2017-12-20 DIAGNOSIS — M62838 Other muscle spasm: Secondary | ICD-10-CM | POA: Insufficient documentation

## 2017-12-20 MED ORDER — CYCLOBENZAPRINE HCL 10 MG PO TABS: ORAL_TABLET | ORAL | 0 refills | Status: DC

## 2017-12-20 MED ORDER — NAPROXEN 500 MG PO TABS: 500.0000 mg | ORAL_TABLET | Freq: Two times a day (BID) | ORAL | 0 refills | Status: DC
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
## 2017-12-20 NOTE — ED Notes (Signed)
Called pt   No response from lobby  

## 2017-12-20 NOTE — ED Notes (Signed)
Called  No response from lobby 

## 2017-12-25 ENCOUNTER — Ambulatory Visit: Payer: Managed Care, Other (non HMO) | Admitting: Medical

## 2017-12-25 ENCOUNTER — Encounter: Payer: Self-pay | Admitting: Medical

## 2017-12-25 VITALS — BP 128/80 | HR 82 | Wt 199.8 lb

## 2017-12-25 DIAGNOSIS — M549 Dorsalgia, unspecified: Secondary | ICD-10-CM

## 2017-12-25 DIAGNOSIS — M542 Cervicalgia: Secondary | ICD-10-CM | POA: Diagnosis not present

## 2017-12-25 DIAGNOSIS — M62838 Other muscle spasm: Secondary | ICD-10-CM | POA: Diagnosis not present

## 2017-12-25 MED ORDER — HYDROCODONE-ACETAMINOPHEN 7.5-325 MG PO TABS: 1.0000 | ORAL_TABLET | Freq: Four times a day (QID) | ORAL | 0 refills | Status: DC | PRN
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
## 2018-01-01 ENCOUNTER — Ambulatory Visit: Payer: No Typology Code available for payment source | Admitting: Physical Therapy

## 2018-01-03 ENCOUNTER — Ambulatory Visit: Payer: Managed Care, Other (non HMO) | Admitting: Medical

## 2018-01-03 ENCOUNTER — Encounter: Payer: Self-pay | Admitting: Medical

## 2018-01-03 ENCOUNTER — Other Ambulatory Visit: Payer: Self-pay

## 2018-01-03 VITALS — BP 118/76 | HR 94 | Wt 197.2 lb

## 2018-01-03 DIAGNOSIS — M25511 Pain in right shoulder: Secondary | ICD-10-CM

## 2018-01-03 DIAGNOSIS — M549 Dorsalgia, unspecified: Secondary | ICD-10-CM

## 2018-01-03 DIAGNOSIS — M79604 Pain in right leg: Secondary | ICD-10-CM

## 2018-01-03 DIAGNOSIS — M79605 Pain in left leg: Secondary | ICD-10-CM | POA: Diagnosis not present

## 2018-01-03 DIAGNOSIS — M62838 Other muscle spasm: Secondary | ICD-10-CM

## 2018-01-03 DIAGNOSIS — M542 Cervicalgia: Secondary | ICD-10-CM | POA: Diagnosis not present

## 2018-01-03 MED ORDER — CYCLOBENZAPRINE HCL 10 MG PO TABS: ORAL_TABLET | ORAL | 0 refills | Status: DC
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
## 2018-01-14 ENCOUNTER — Ambulatory Visit: Payer: Managed Care, Other (non HMO) | Attending: Medical

## 2018-01-14 DIAGNOSIS — M6281 Muscle weakness (generalized): Secondary | ICD-10-CM | POA: Diagnosis present

## 2018-01-14 DIAGNOSIS — M545 Low back pain, unspecified: Secondary | ICD-10-CM

## 2018-01-14 DIAGNOSIS — R293 Abnormal posture: Secondary | ICD-10-CM | POA: Insufficient documentation

## 2018-01-14 DIAGNOSIS — M542 Cervicalgia: Secondary | ICD-10-CM

## 2018-01-14 DIAGNOSIS — M2560 Stiffness of unspecified joint, not elsewhere classified: Secondary | ICD-10-CM | POA: Diagnosis present
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
## 2018-01-14 NOTE — Patient Instructions (Signed)
Cervical rotation and sde bend stretch , knee to chest and across body 2-3x/day 2-3 reps 30 sec

## 2018-01-16 ENCOUNTER — Ambulatory Visit: Payer: Managed Care, Other (non HMO) | Admitting: Physical Therapy

## 2018-01-16 ENCOUNTER — Encounter: Payer: Self-pay | Admitting: Physical Therapy

## 2018-01-16 DIAGNOSIS — R293 Abnormal posture: Secondary | ICD-10-CM

## 2018-01-16 DIAGNOSIS — M542 Cervicalgia: Secondary | ICD-10-CM

## 2018-01-16 DIAGNOSIS — M545 Low back pain, unspecified: Secondary | ICD-10-CM

## 2018-01-16 DIAGNOSIS — M6281 Muscle weakness (generalized): Secondary | ICD-10-CM

## 2018-01-16 DIAGNOSIS — M2560 Stiffness of unspecified joint, not elsewhere classified: Secondary | ICD-10-CM

## 2018-01-16 NOTE — Patient Instructions (Addendum)
Trigger Point Dry Needling  . What is Trigger Point Dry Needling (DN)? o DN is a physical therapy technique used to treat muscle pain and dysfunction. Specifically, DN helps deactivate muscle trigger points (muscle knots).  o A thin filiform needle is used to penetrate the skin and stimulate the underlying trigger point. The goal is for a local twitch response (LTR) to occur and for the trigger point to relax. No medication of any kind is injected during the procedure.   . What Does Trigger Point Dry Needling Feel Like?  o The procedure feels different for each individual patient. Some patients report that they do not actually feel the needle enter the skin and overall the process is not painful. Very mild bleeding may occur. However, many patients feel a deep cramping in the muscle in which the needle was inserted. This is the local twitch response.   Marland Kitchen. How Will I feel after the treatment? o Soreness is normal, and the onset of soreness may not occur for a few hours. Typically this soreness does not last longer than two days.  o Bruising is uncommon, however; ice can be used to decrease any possible bruising.  o In rare cases feeling tired or nauseous after the treatment is normal. In addition, your symptoms may get worse before they get better, this period will typically not last longer than 24 hours.   . What Can I do After My Treatment? o Increase your hydration by drinking more water for the next 24 hours. o You may place ice or heat on the areas treated that have become sore, however, do not use heat on inflamed or bruised areas. Heat often brings more relief post needling. o You can continue your regular activities, but vigorous activity is not recommended initially after the treatment for 24 hours. o DN is best combined with other physical therapy such as strengthening, stretching, and other therapies.  Suboccipital Stretch (Supine)    With small towel roll at base of skull and upper  neck. Gently tuck chin until stretch is felt at base of skull and upper neck. Hold _20- 30 seconds___ seconds. Relax. Repeat _3___ times per set. Do _1___ sets per session. Do __2__ sessions per day.  http://orth.exer.us/984   Copyright  VHI. All rights reserved.  Aaron Williamson, PT Certified Exercise Expert for the Aging Adult  01/16/18 11:57 AM Phone: [phone] Fax: [phone]
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
## 2018-01-22 ENCOUNTER — Encounter: Payer: Self-pay | Admitting: Physical Therapy

## 2018-01-22 ENCOUNTER — Other Ambulatory Visit: Payer: Self-pay

## 2018-01-22 ENCOUNTER — Ambulatory Visit: Payer: Managed Care, Other (non HMO) | Admitting: Physical Therapy

## 2018-01-22 DIAGNOSIS — M2560 Stiffness of unspecified joint, not elsewhere classified: Secondary | ICD-10-CM

## 2018-01-22 DIAGNOSIS — M542 Cervicalgia: Secondary | ICD-10-CM | POA: Diagnosis not present

## 2018-01-22 DIAGNOSIS — M545 Low back pain, unspecified: Secondary | ICD-10-CM

## 2018-01-22 DIAGNOSIS — M6281 Muscle weakness (generalized): Secondary | ICD-10-CM
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
## 2018-01-27 ENCOUNTER — Ambulatory Visit: Payer: Managed Care, Other (non HMO)

## 2018-01-28 ENCOUNTER — Ambulatory Visit: Payer: Managed Care, Other (non HMO) | Admitting: Physical Therapy

## 2018-01-28 ENCOUNTER — Encounter: Payer: Self-pay | Admitting: Medical

## 2018-01-28 ENCOUNTER — Ambulatory Visit: Payer: Managed Care, Other (non HMO) | Admitting: Medical

## 2018-01-28 VITALS — BP 140/84 | HR 79 | Temp 98.3°F | Ht 75.0 in | Wt 203.0 lb

## 2018-01-28 DIAGNOSIS — M545 Low back pain, unspecified: Secondary | ICD-10-CM

## 2018-01-28 DIAGNOSIS — M549 Dorsalgia, unspecified: Secondary | ICD-10-CM

## 2018-01-28 DIAGNOSIS — L989 Disorder of the skin and subcutaneous tissue, unspecified: Secondary | ICD-10-CM | POA: Diagnosis not present

## 2018-01-28 DIAGNOSIS — M62838 Other muscle spasm: Secondary | ICD-10-CM

## 2018-01-28 DIAGNOSIS — M542 Cervicalgia: Secondary | ICD-10-CM | POA: Diagnosis not present

## 2018-01-28 DIAGNOSIS — M6281 Muscle weakness (generalized): Secondary | ICD-10-CM

## 2018-01-28 DIAGNOSIS — M2560 Stiffness of unspecified joint, not elsewhere classified: Secondary | ICD-10-CM

## 2018-01-28 DIAGNOSIS — M25511 Pain in right shoulder: Secondary | ICD-10-CM

## 2018-01-28 DIAGNOSIS — R293 Abnormal posture: Secondary | ICD-10-CM

## 2018-01-28 MED ORDER — CLOTRIMAZOLE-BETAMETHASONE 1-0.05 % EX CREA: 1.0000 "application " | TOPICAL_CREAM | Freq: Two times a day (BID) | CUTANEOUS | 0 refills | Status: DC

## 2018-01-28 NOTE — Patient Instructions (Addendum)
Sleeping on Back  Place pillow under knees. A pillow with cervical support and a roll around waist are also helpful. Copyright  VHI. All rights reserved.  Sleeping on Side Place pillow between knees. Use cervical support under neck and a roll around waist as needed. Copyright  VHI. All rights reserved.   Sleeping on Stomach   If this is the only desirable sleeping position, place pillow under lower legs, and under stomach or chest as needed.  Posture - Sitting   Sit upright, head facing forward. Try using a roll to support lower back. Keep shoulders relaxed, and avoid rounded back. Keep hips level with knees. Avoid crossing legs for long periods. Stand to Sit / Sit to Stand   To sit: Bend knees to lower self onto front edge of chair, then scoot back on seat. To stand: Reverse sequence by placing one foot forward, and scoot to front of seat. Use rocking motion to stand up.   Work Height and Reach  Ideal work height is no more than 2 to 4 inches below elbow level when standing, and at elbow level when sitting. Reaching should be limited to arm's length, with elbows slightly bent.  Bending  Bend at hips and knees, not back. Keep feet shoulder-width apart.    Posture - Standing   Good posture is important. Avoid slouching and forward head thrust. Maintain curve in low back and align ears over shoul- ders, hips over ankles.  Alternating Positions   Alternate tasks and change positions frequently to reduce fatigue and muscle tension. Take rest breaks. Computer Work   Position work to face forward. Use proper work and seat height. Keep shoulders back and down, wrists straight, and elbows at right angles. Use chair that provides full back support. Add footrest and lumbar roll as needed.  Getting Into / Out of Car  Lower self onto seat, scoot back, then bring in one leg at a time. Reverse sequence to get out.  Dressing  Lie on back to pull socks or slacks over feet, or sit  and bend leg while keeping back straight.    Housework - Sink  Place one foot on ledge of cabinet under sink when standing at sink for prolonged periods.   Pushing / Pulling  Pushing is preferable to pulling. Keep back in proper alignment, and use leg muscles to do the work.  Deep Squat   Squat and lift with both arms held against upper trunk. Tighten stomach muscles without holding breath. Use smooth movements to avoid jerking.  Avoid Twisting   Avoid twisting or bending back. Pivot around using foot movements, and bend at knees if needed when reaching for articles.  Carrying Luggage   Distribute weight evenly on both sides. Use a cart whenever possible. Do not twist trunk. Move body as a unit.   Lifting Principles .Maintain proper posture and head alignment. .Slide object as close as possible before lifting. .Move obstacles out of the way. .Test before lifting; ask for help if too heavy. .Tighten stomach muscles without holding breath. .Use smooth movements; do not jerk. .Use legs to do the work, and pivot with feet. .Distribute the work load symmetrically and close to the center of trunk. .Push instead of pull whenever possible.   Ask For Help   Ask for help and delegate to others when possible. Coordinate your movements when lifting together, and maintain the low back curve.  Log Roll   Lying on back, bend left knee and place left   arm across chest. Roll all in one movement to the right. Reverse to roll to the left. Always move as one unit. Housework - Sweeping  Use long-handled equipment to avoid stooping.   Housework - Wiping  Position yourself as close as possible to reach work surface. Avoid straining your back.  Laundry - Unloading Wash   To unload small items at bottom of washer, lift leg opposite to arm being used to reach.  Gardening - Raking  Move close to area to be raked. Use arm movements to do the work. Keep back straight and avoid  twisting.     Cart  When reaching into cart with one arm, lift opposite leg to keep back straight.   Getting Into / Out of Bed  Lower self to lie down on one side by raising legs and lowering head at the same time. Use arms to assist moving without twisting. Bend both knees to roll onto back if desired. To sit up, start from lying on side, and use same move-ments in reverse. Housework - Vacuuming  Hold the vacuum with arm held at side. Step back and forth to move it, keeping head up. Avoid twisting.   Laundry - Armed forces training and education officerLoading Wash  Position laundry basket so that bending and twisting can be avoided.   Laundry - Unloading Dryer  Squat down to reach into clothes dryer or use a reacher.  Gardening - Weeding / Psychiatric nurselanting  Squat or Kneel. Knee pads may be helpful.                    1. Rocking- arms are straight, head is up with eye looking across room and toes are under and sit back on the feel.  Rock as far over your shoulders as you can without losing your posture.  Engage abdominals and think and feel how shoulder blades are moving.  With time you will increase your range of motion  Go forward 20 x slowly and controlled,  Then counterclockwise circles /clockwise circles 20 x each    Aaron LahLawrie Jackie Williamson, PT Certified Exercise Expert for the Aging Adult  01/28/18 4:47 PM Phone: [phone] Fax: [phone]
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
## 2018-02-04 ENCOUNTER — Encounter: Payer: Managed Care, Other (non HMO) | Admitting: Physical Therapy

## 2018-02-05 ENCOUNTER — Ambulatory Visit: Payer: Managed Care, Other (non HMO)

## 2018-02-06 ENCOUNTER — Encounter: Payer: Managed Care, Other (non HMO) | Admitting: Physical Therapy

## 2018-02-13 ENCOUNTER — Encounter: Payer: Managed Care, Other (non HMO) | Admitting: Physical Therapy

## 2018-02-14 DIAGNOSIS — Z0279 Encounter for issue of other medical certificate: Secondary | ICD-10-CM

## 2018-02-18 ENCOUNTER — Encounter: Payer: Managed Care, Other (non HMO) | Admitting: Physical Therapy

## 2018-03-05 DIAGNOSIS — Z0279 Encounter for issue of other medical certificate: Secondary | ICD-10-CM

## 2019-01-23 ENCOUNTER — Ambulatory Visit: Payer: Managed Care, Other (non HMO) | Admitting: Medical

## 2019-01-23 ENCOUNTER — Other Ambulatory Visit: Payer: Self-pay

## 2019-01-23 ENCOUNTER — Encounter: Payer: Self-pay | Admitting: Medical

## 2019-01-23 VITALS — BP 130/82 | HR 83 | Temp 98.0°F | Resp 16 | Ht 75.0 in | Wt 206.6 lb

## 2019-01-23 DIAGNOSIS — J301 Allergic rhinitis due to pollen: Secondary | ICD-10-CM | POA: Insufficient documentation

## 2019-01-23 DIAGNOSIS — R0681 Apnea, not elsewhere classified: Secondary | ICD-10-CM | POA: Insufficient documentation

## 2019-01-23 DIAGNOSIS — R0683 Snoring: Secondary | ICD-10-CM | POA: Diagnosis not present

## 2019-01-23 MED ORDER — LEVOCETIRIZINE DIHYDROCHLORIDE 5 MG PO TABS: 5.0000 mg | ORAL_TABLET | Freq: Every evening | ORAL | 11 refills | Status: DC

## 2019-01-23 MED ORDER — FLUTICASONE PROPIONATE 50 MCG/ACT NA SUSP: 2.0000 | Freq: Every day | NASAL | 11 refills | Status: DC

## 2019-01-23 NOTE — Progress Notes (Signed)
Subjective: Chief Complaint  Patient presents with  . tickle in throat    achey, tickle in throat, headache x monday   Here for tickle in throat, achy, headache, slight cough, some sinus pressure, allergies, some runny nose, some sneezing.  No chills.   No fever, no NVD.  No sick contacts.  No abdominal pain.   Using Theraflu and Nyquil.   No other aggravating or relieving factors.   He snores, has witnessed apnea, not restful sleep.   No other complaint.  Past Medical History:  Diagnosis Date  . Allergy    No current outpatient medications on file prior to visit.   No current facility-administered medications on file prior to visit.    ROS as in subjective    Objective: BP 130/82   Pulse 83   Temp 98 F (36.7 C) (Oral)   Resp 16   Ht 6\' 3"  (1.905 m)   Wt 206 lb 9.6 oz (93.7 kg)   SpO2 97%   BMI 25.82 kg/m   General appearance: alert, no distress, WD/WN,  HEENT: normocephalic, sclerae anicteric, TMs pearly, nares patent,clear discharge , no erythema, pharynx normal Oral cavity: MMM, no lesions Neck: supple, no lymphadenopathy, no thyromegaly, no masses Heart: RRR, normal S1, S2, no murmurs Lungs: CTA bilaterally, no wheezes, rhonchi, or rales Pulses: 2+ symmetric, upper and lower extremities, normal cap refill   Assessment: Encounter Diagnoses  Name Primary?  . Allergic rhinitis due to pollen, unspecified seasonality Yes  . Snoring   . Witnessed apneic spells      Plan: Begin regimen below, use daily nasal saline flush, avoid allergen triggers, and if not much improved in 2-3 weeks, call back or recheck  Snoring, witnessed apnea - referral for sleep study  Aaron Williamson was seen today for tickle in throat.  Diagnoses and all orders for this visit:  Allergic rhinitis due to pollen, unspecified seasonality  Snoring  Witnessed apneic spells  Other orders -     levocetirizine (XYZAL) 5 MG tablet; Take 1 tablet (5 mg total) by mouth every evening. -      fluticasone (FLONASE) 50 MCG/ACT nasal spray; Place 2 sprays into both nostrils daily.

## 2019-01-23 NOTE — Addendum Note (Signed)
Addended by: Derinda Late on: 01/23/2019 11:54 AM   Modules accepted: Orders

## 2019-03-30 ENCOUNTER — Other Ambulatory Visit: Payer: Self-pay

## 2019-03-30 ENCOUNTER — Ambulatory Visit (HOSPITAL_BASED_OUTPATIENT_CLINIC_OR_DEPARTMENT_OTHER): Payer: Managed Care, Other (non HMO) | Attending: Medical | Admitting: Internal Medicine

## 2019-03-30 VITALS — Ht 75.0 in | Wt 195.0 lb

## 2019-03-30 DIAGNOSIS — R0681 Apnea, not elsewhere classified: Secondary | ICD-10-CM

## 2019-03-30 DIAGNOSIS — G4733 Obstructive sleep apnea (adult) (pediatric): Secondary | ICD-10-CM | POA: Insufficient documentation

## 2019-03-30 DIAGNOSIS — R0683 Snoring: Secondary | ICD-10-CM

## 2019-04-03 DIAGNOSIS — R0683 Snoring: Secondary | ICD-10-CM

## 2019-04-03 DIAGNOSIS — R0681 Apnea, not elsewhere classified: Secondary | ICD-10-CM | POA: Diagnosis not present

## 2019-04-03 NOTE — Procedures (Signed)
    Patient Name: Aaron Williamson, Walls Date: 03/30/2019 Gender: Male D.O.B: [birth year] Age (years): 37 Referring Provider: Crosby Oyster Height (inches): 75 Interpreting Physician: Jetty Duhamel MD, ABSM Weight (lbs): 195 RPSGT:  [place] BMI: 24 MRN: [id number] Neck Size: 17.50  CLINICAL INFORMATION Sleep Study Type: HST Indication for sleep study: OSA, Snoring (786.09) Epworth Sleepiness Score: 14  SLEEP STUDY TECHNIQUE A multi-channel overnight portable sleep study was performed. The channels recorded were: nasal airflow, thoracic respiratory movement, and oxygen saturation with a pulse oximetry. Snoring was also monitored.  MEDICATIONS Patient self administered medications include: none reported.  SLEEP ARCHITECTURE Patient was studied for 593.4 minutes. The sleep efficiency was 100.0 % and the patient was supine for 90.7%. The arousal index was 0.0 per hour.  RESPIRATORY PARAMETERS The overall AHI was 24.9 per hour, with a central apnea index of 0.0 per hour. The oxygen nadir was 77% during sleep.  CARDIAC DATA Mean heart rate during sleep was 63.1 bpm.  IMPRESSIONS - Moderate obstructive sleep apnea occurred during this study (AHI = 24.9/h). - No significant central sleep apnea occurred during this study (CAI = 0.0/h). - Oxygen desaturation was noted during this study (Min O2 = 77%). Mean sat 98%. - Patient snored 11.5% during the sleep.  DIAGNOSIS - Obstructive Sleep Apnea (327.23 [G47.33 ICD-10])  RECOMMENDATIONS - Suggest CPAP titration sleep study or autopap. Other options may include a fitted oral appliance, Sleep medicine consultation or ENT evaluation. - Be careful with alcohol, sedatives and other CNS depressants that may worsen sleep apnea and disrupt normal sleep architecture. - Sleep hygiene should be reviewed to assess factors that may improve sleep quality. - Weight management and regular exercise should be initiated or continued.   [Electronically signed] 04/03/2019 02:31 PM  Jetty Duhamel MD, ABSM Diplomate, American Board of Sleep Medicine   NPI: [id number]                         Jetty Duhamel Diplomate, American Board of Sleep Medicine  ELECTRONICALLY SIGNED ON:  04/03/2019, 2:28 PM [facility] PH: [phone]   FX: [phone] ACCREDITED BY THE AMERICAN ACADEMY OF SLEEP MEDICINE

## 2019-04-08 ENCOUNTER — Telehealth: Payer: Self-pay | Admitting: Medical

## 2019-04-08 NOTE — Telephone Encounter (Signed)
See other note about virtual consult request for sleep study results and next steps

## 2019-04-09 NOTE — Telephone Encounter (Signed)
Patient has an appointment 04-10-19.

## 2019-04-10 ENCOUNTER — Other Ambulatory Visit: Payer: Self-pay

## 2019-04-10 ENCOUNTER — Ambulatory Visit (INDEPENDENT_AMBULATORY_CARE_PROVIDER_SITE_OTHER): Payer: Managed Care, Other (non HMO) | Admitting: Medical

## 2019-04-10 ENCOUNTER — Encounter: Payer: Self-pay | Admitting: Medical

## 2019-04-10 VITALS — Temp 98.2°F | Ht 75.0 in | Wt 188.0 lb

## 2019-04-10 DIAGNOSIS — R0681 Apnea, not elsewhere classified: Secondary | ICD-10-CM

## 2019-04-10 DIAGNOSIS — G4733 Obstructive sleep apnea (adult) (pediatric): Secondary | ICD-10-CM

## 2019-04-10 DIAGNOSIS — R0683 Snoring: Secondary | ICD-10-CM

## 2019-04-10 NOTE — Progress Notes (Signed)
  Subjective:     Patient ID: Aaron Williamson, male   DOB: 09-Jul-1981, 38 y.o.   MRN: [id number]  This visit type was conducted due to national recommendations for restrictions regarding the COVID-19 Pandemic (e.g. social distancing) in an effort to limit this patient's exposure and mitigate transmission in our community.  This format is felt to be most appropriate for this patient at this time.    Documentation for virtual audio and video telecommunications through Zoom encounter:  The patient was located at home. The provider was located in the office. The patient did consent to this visit and is aware of possible charges through their insurance for this visit.  The other persons participating in this telemedicine service were none. Time spent on call was  12 minutes and in review of previous records >12 minutes total.  This virtual service is not related to other E/M service within previous 7 days.   HPI Chief Complaint  Patient presents with  . follow up    follow up get sleep study results   Virtual consult for sleep study results.    He had sleep study recently.   Girlfriend just last night said he quit breathing for 15 seconds.  He has hx/o snoring, witnessed apnea, fatigue, daytime somnolence.   No other aggravating or relieving factors. No other complaint.  Past Medical History:  Diagnosis Date  . Allergy    Current Outpatient Medications on File Prior to Visit  Medication Sig Dispense Refill  . fluticasone (FLONASE) 50 MCG/ACT nasal spray Place 2 sprays into both nostrils daily. 16 g 11  . levocetirizine (XYZAL) 5 MG tablet Take 1 tablet (5 mg total) by mouth every evening. 30 tablet 11   No current facility-administered medications on file prior to visit.      Review of Systems As in subjective    Objective:   Physical Exam  Temp 98.2 F (36.8 C) (Oral)   Ht 6\' 3"  (1.905 m)   Wt 188 lb (85.3 kg)   BMI 23.50 kg/m   Due to coronavirus pandemic stay at  home measures, patient visit was virtual and they were not examined in person.       Assessment:     Encounter Diagnoses  Name Primary?  . Snoring Yes  . Witnessed apneic spells   . OSA (obstructive sleep apnea)        Plan:     discussed sleep study results showing moderate sleep apnea, almost 25 AHI, oxygen nadir 77%.   Advised he begin CPAP, try sleeping on side of belly instead of supine, can raise head of bed, c/t regular exercise.   We discussed use of CPAP.  discussed possible complications of OSA.   Plan to f/u in 3mo and review compliance report at that time.     Tyrei was seen today for follow up.  Diagnoses and all orders for this visit:  Snoring  Witnessed apneic spells  OSA (obstructive sleep apnea)

## 2019-04-10 NOTE — Progress Notes (Signed)
Orders put in and message sent to gilda and [address]

## 2019-09-14 ENCOUNTER — Telehealth: Payer: Self-pay | Admitting: Medical

## 2019-09-14 NOTE — Telephone Encounter (Signed)
Dismissal letter in guarantor snapshot  °

## 2020-04-22 ENCOUNTER — Ambulatory Visit: Payer: Self-pay | Admitting: Family Medicine

## 2020-05-13 ENCOUNTER — Other Ambulatory Visit: Payer: Self-pay

## 2020-05-13 ENCOUNTER — Ambulatory Visit: Payer: 59 | Admitting: Family Medicine

## 2020-05-13 ENCOUNTER — Encounter: Payer: Self-pay | Admitting: Family Medicine

## 2020-05-13 VITALS — BP 124/76 | HR 75 | Temp 98.1°F | Ht 75.0 in | Wt 220.8 lb

## 2020-05-13 DIAGNOSIS — M2569 Stiffness of other specified joint, not elsewhere classified: Secondary | ICD-10-CM

## 2020-05-13 DIAGNOSIS — R062 Wheezing: Secondary | ICD-10-CM | POA: Diagnosis not present

## 2020-05-13 DIAGNOSIS — G4733 Obstructive sleep apnea (adult) (pediatric): Secondary | ICD-10-CM | POA: Diagnosis not present

## 2020-05-13 DIAGNOSIS — L280 Lichen simplex chronicus: Secondary | ICD-10-CM

## 2020-05-13 MED ORDER — CYCLOBENZAPRINE HCL 10 MG PO TABS: 10.0000 mg | ORAL_TABLET | Freq: Three times a day (TID) | ORAL | 0 refills | Status: DC | PRN

## 2020-05-13 NOTE — Patient Instructions (Signed)
We are sending in orders for the CPAP machine  I will set up referral to dermatologist for right arm lesion.
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
## 2020-05-17 ENCOUNTER — Encounter: Payer: Self-pay | Admitting: Family Medicine

## 2020-05-17 NOTE — Telephone Encounter (Signed)
Pt confirmed that he does need the Proair but also needing the flonase and [facility] [place], [place] - [address] Phone:  [phone]  Fax:  [phone]

## 2020-05-18 MED ORDER — PROAIR RESPICLICK 108 (90 BASE) MCG/ACT IN AEPB: 2.0000 | INHALATION_SPRAY | RESPIRATORY_TRACT | 1 refills | Status: DC | PRN

## 2020-05-18 MED ORDER — FLUTICASONE PROPIONATE 50 MCG/ACT NA SUSP: 2.0000 | Freq: Every day | NASAL | 1 refills | Status: DC

## 2020-05-18 MED ORDER — LEVOCETIRIZINE DIHYDROCHLORIDE 5 MG PO TABS: 5.0000 mg | ORAL_TABLET | Freq: Every evening | ORAL | 1 refills | Status: DC

## 2020-05-23 ENCOUNTER — Telehealth: Payer: Self-pay | Admitting: Family Medicine

## 2020-05-23 MED ORDER — ALBUTEROL SULFATE HFA 108 (90 BASE) MCG/ACT IN AERS
2.0000 | INHALATION_SPRAY | Freq: Four times a day (QID) | RESPIRATORY_TRACT | 2 refills | Status: DC | PRN
Start: 2020-05-23 — End: 2021-07-14

## 2020-05-23 NOTE — Telephone Encounter (Signed)
Medication sent in. 

## 2020-05-23 NOTE — Addendum Note (Signed)
Addended by: Raiford Simmonds R on: 05/23/2020 12:49 PM   Modules accepted: Orders

## 2020-05-23 NOTE — Telephone Encounter (Signed)
Please advise if okay to change

## 2020-05-23 NOTE — Telephone Encounter (Signed)
The pharmacy [facility]) is calling in stating that the pts albuterol sulfate (PROAIR RESPICLICK) is not covered through the insurance the one the is covered through insurance is the regular albuterol sulfate (PROAIR).

## 2020-05-23 NOTE — Telephone Encounter (Signed)
OK to change?

## 2020-05-31 ENCOUNTER — Encounter: Payer: Self-pay | Admitting: Family Medicine

## 2020-05-31 DIAGNOSIS — L989 Disorder of the skin and subcutaneous tissue, unspecified: Secondary | ICD-10-CM

## 2020-06-01 NOTE — Telephone Encounter (Signed)
I placed order for referral to dermatology

## 2020-06-15 ENCOUNTER — Telehealth: Payer: Self-pay | Admitting: Family Medicine

## 2020-06-15 NOTE — Telephone Encounter (Signed)
Debbie from [employer] called to say they are waiting on the form so that the pt can get his supplies.  She said they received confirmation that the fax came through this morning at 8:37 am  Please advise

## 2020-06-15 NOTE — Telephone Encounter (Signed)
Placed in red folder  

## 2020-07-21 ENCOUNTER — Encounter: Payer: Self-pay | Admitting: Family Medicine

## 2021-01-30 ENCOUNTER — Encounter: Payer: Self-pay | Admitting: Family Medicine

## 2021-01-31 MED ORDER — OLOPATADINE HCL 0.1 % OP SOLN: 1.0000 [drp] | Freq: Two times a day (BID) | OPHTHALMIC | 5 refills | Status: DC

## 2021-07-14 ENCOUNTER — Ambulatory Visit: Payer: 59 | Admitting: Family Medicine

## 2021-07-14 ENCOUNTER — Other Ambulatory Visit: Payer: Self-pay

## 2021-07-14 ENCOUNTER — Encounter: Payer: Self-pay | Admitting: Family Medicine

## 2021-07-14 VITALS — BP 132/80 | HR 61 | Temp 97.8°F | Ht 75.0 in | Wt 241.0 lb

## 2021-07-14 DIAGNOSIS — M25561 Pain in right knee: Secondary | ICD-10-CM

## 2021-07-14 MED ORDER — ALBUTEROL SULFATE HFA 108 (90 BASE) MCG/ACT IN AERS: 2.0000 | INHALATION_SPRAY | Freq: Four times a day (QID) | RESPIRATORY_TRACT | 2 refills | Status: DC | PRN

## 2021-07-14 MED ORDER — MELOXICAM 15 MG PO TABS
15.0000 mg | ORAL_TABLET | Freq: Every day | ORAL | 1 refills | Status: DC
Start: 2021-07-14 — End: 2021-11-24

## 2021-07-14 NOTE — Progress Notes (Signed)
Established Patient Office Visit  Subjective:  Patient ID: Aaron Williamson, male    DOB: 02-08-1981  Age: 40 y.o. MRN: [id number]  CC:  Chief Complaint  Patient presents with   Knee Pain    X 2 weeks, right knee. Was swollen, used ice and it went down. Now the sides of his knee are hurting, worse in the mornings. Has a very physical job.     HPI Aaron Williamson presents for right knee pain for the past couple weeks at least.  He states he had little bit of swelling possibly couple weeks ago and tried some icing.  He has pain which is somewhat bilateral and sensation of pain deep in the knee worse after prolonged periods of sitting.  Actually improved some when he gets up to move or straighten his leg.  No warmth.  No erythema.  No known injury.  No locking or giving way.  No history of gout or pseudogout.  Past Medical History:  Diagnosis Date   Allergy    Asthma     Past Surgical History:  Procedure Laterality Date   ORCHIECTOMY N/A 03/16/2017   Procedure: IRRIGATION AND DEBRIDEMENT SCROTUM, LEFT ORCHIECTOMY;  Surgeon: Ihor Gully, MD;  Location: [facility];  Service: Urology;  Laterality: N/A;    Family History  Problem Relation Age of Onset   Diabetes Mother    Arthritis Mother    Hypertension Sister    Diabetes Sister     Social History   Socioeconomic History   Marital status: Single    Spouse name: Not on file   Number of children: Not on file   Years of education: Not on file   Highest education level: Not on file  Occupational History   Not on file  Tobacco Use   Smoking status: Never   Smokeless tobacco: Never  Substance and Sexual Activity   Alcohol use: Yes   Drug use: No   Sexual activity: Yes  Other Topics Concern   Not on file  Social History Narrative   Not on file   Social Determinants of Health   Financial Resource Strain: Not on file  Food Insecurity: Not on file  Transportation Needs: Not on file  Physical Activity: Not on file   Stress: Not on file  Social Connections: Not on file  Intimate Partner Violence: Not on file    Outpatient Medications Prior to Visit  Medication Sig Dispense Refill   cyclobenzaprine (FLEXERIL) 10 MG tablet Take 1 tablet (10 mg total) by mouth 3 (three) times daily as needed for muscle spasms. 30 tablet 0   fluticasone (FLONASE) 50 MCG/ACT nasal spray Place 2 sprays into both nostrils daily. 16 g 1   levocetirizine (XYZAL) 5 MG tablet Take 1 tablet (5 mg total) by mouth every evening. 30 tablet 1   olopatadine (PATADAY) 0.1 % ophthalmic solution Place 1 drop into both eyes 2 (two) times daily. 5 mL 5   albuterol (VENTOLIN HFA) 108 (90 Base) MCG/ACT inhaler Inhale 2 puffs into the lungs every 6 (six) hours as needed for wheezing or shortness of breath. 18 g 2   No facility-administered medications prior to visit.    Allergies  Allergen Reactions   Fish Allergy Swelling    ROS Review of Systems  Constitutional:  Negative for chills and fever.  Neurological:  Negative for weakness.     Objective:    Physical Exam Vitals reviewed.  Constitutional:      Appearance: Normal  appearance.  Cardiovascular:     Rate and Rhythm: Normal rate and regular rhythm.  Musculoskeletal:     Comments: Right knee reveals full range of motion.  No warmth.  No erythema.  No effusion.  Ligament testing is normal.  No medial or lateral joint line tenderness.  No patellar tenderness.  Neurological:     Mental Status: He is alert.    BP 132/80   Pulse 61   Temp 97.8 F (36.6 C) (Oral)   Ht 6\' 3"  (1.905 m)   Wt 241 lb (109.3 kg)   SpO2 98%   BMI 30.12 kg/m  Wt Readings from Last 3 Encounters:  07/14/21 241 lb (109.3 kg)  05/13/20 220 lb 12.8 oz (100.2 kg)  04/10/19 188 lb (85.3 kg)     Health Maintenance Due  Topic Date Due   COVID-19 Vaccine (1) Never done   Hepatitis C Screening  Never done   INFLUENZA VACCINE  06/12/2021    There are no preventive care reminders to display for  this patient.  Lab Results  Component Value Date   TSH 0.99 11/19/2016   Lab Results  Component Value Date   WBC 13.9 (H) 03/16/2017   HGB 13.0 03/16/2017   HCT 39.5 03/16/2017   MCV 87.8 03/16/2017   PLT 330 03/16/2017   Lab Results  Component Value Date   NA 134 (L) 03/16/2017   K 3.8 03/16/2017   CO2 27 03/16/2017   GLUCOSE 106 (H) 03/16/2017   BUN 7 03/16/2017   CREATININE 0.95 03/16/2017   BILITOT 0.5 11/19/2016   ALKPHOS 67 11/19/2016   AST 20 11/19/2016   ALT 24 11/19/2016   PROT 8.4 (H) 11/19/2016   ALBUMIN 4.7 11/19/2016   CALCIUM 9.0 03/16/2017   ANIONGAP 6 03/16/2017   GFR 109.94 11/19/2016   Lab Results  Component Value Date   CHOL 162 11/19/2016   Lab Results  Component Value Date   HDL 56.70 11/19/2016   Lab Results  Component Value Date   LDLCALC 88 11/19/2016   Lab Results  Component Value Date   TRIG 86.0 11/19/2016   Lab Results  Component Value Date   CHOLHDL 3 11/19/2016   Lab Results  Component Value Date   HGBA1C 5.6 11/19/2016      Assessment & Plan:   Right knee pain.  Suspect possible patellofemoral syndrome.  Nonfocal exam.  -We discussed exercises for quadricep strengthening and we recommend do those daily as much as possible -Consider short-term trial of meloxicam 15 mg once daily -Consider trial of physical therapy or sports medicine referral if not improving with the above.  He does have access to a gym.  Meds ordered this encounter  Medications   meloxicam (MOBIC) 15 MG tablet    Sig: Take 1 tablet (15 mg total) by mouth daily.    Dispense:  30 tablet    Refill:  1   albuterol (VENTOLIN HFA) 108 (90 Base) MCG/ACT inhaler    Sig: Inhale 2 puffs into the lungs every 6 (six) hours as needed for wheezing or shortness of breath.    Dispense:  18 g    Refill:  2    Follow-up: No follow-ups on file.    01/17/2017, MD

## 2021-07-19 ENCOUNTER — Other Ambulatory Visit: Payer: Self-pay

## 2021-07-19 MED ORDER — LEVOCETIRIZINE DIHYDROCHLORIDE 5 MG PO TABS: 5.0000 mg | ORAL_TABLET | Freq: Every evening | ORAL | 11 refills | Status: DC

## 2021-11-08 ENCOUNTER — Encounter: Payer: Self-pay | Admitting: Family Medicine

## 2021-11-08 NOTE — Telephone Encounter (Signed)
Last sleep study done 03/30/2019 in chart and recent order 05/13/20. Only needs order done and all can be sent.

## 2021-11-20 ENCOUNTER — Ambulatory Visit
Admission: EM | Admit: 2021-11-20 | Discharge: 2021-11-20 | Disposition: A | Discharge status: Home / Self Care | Payer: 59 | Attending: Emergency Medicine | Admitting: Emergency Medicine

## 2021-11-20 ENCOUNTER — Other Ambulatory Visit: Payer: Self-pay

## 2021-11-20 DIAGNOSIS — R0781 Pleurodynia: Secondary | ICD-10-CM | POA: Diagnosis not present

## 2021-11-20 DIAGNOSIS — M546 Pain in thoracic spine: Secondary | ICD-10-CM | POA: Diagnosis not present

## 2021-11-20 MED ORDER — PREDNISONE 20 MG PO TABS: 40.0000 mg | ORAL_TABLET | Freq: Every day | ORAL | 0 refills | Status: DC

## 2021-11-20 MED ORDER — METHYLPREDNISOLONE SODIUM SUCC 40 MG IJ SOLR
60.0000 mg | Freq: Once | INTRAMUSCULAR | Status: AC
  Administered 2021-11-20: 60 mg via INTRAMUSCULAR

## 2021-11-20 MED ORDER — CYCLOBENZAPRINE HCL 10 MG PO TABS: 10.0000 mg | ORAL_TABLET | Freq: Every day | ORAL | 0 refills | Status: DC
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
## 2021-11-20 NOTE — ED Triage Notes (Addendum)
Pt reports was working last Sat on toilets, was lifting toilets to remove and hurt his right side, pain worse today. Hurts with movements. Pain more in the lateral right rib area, pt did not fall on side, nor has anything hit him in the side. Pt unsure if he stained his muscles while lifting.   Denis blood in urine.

## 2021-11-20 NOTE — Discharge Instructions (Addendum)
Your pain is most likely caused by irritation to the muscles or ligaments.   Take prednisone every morning with food for the next 5 days beginning tomorrow, while using this medicine avoid use of ibuprofen, naproxen, Aleve, Motrin, may use Tylenol 1000 mg every 6 hours for additional comfort throughout the daytime  You may use Flexeril at bedtime for additional comfort  You may use heating pad in 15 minute intervals as needed for additional comfort or   you may find comfort in using ice in 10-15 minutes over affected area  Begin stretching affected area daily for 10 minutes as tolerated to further loosen muscles   When lying down place pillow underneath the chest and behind back while lying and sitting down, may purchase an abdominal binder for when up during activities for additional support  If pain persist after recommended treatment or reoccurs if may be beneficial to follow up with orthopedic specialist for evaluation, this doctor specializes in the bones and can manage your symptoms long-term with options such as but not limited to imaging, medications or physical therapy

## 2021-11-24 ENCOUNTER — Ambulatory Visit: Payer: 59 | Admitting: Family Medicine

## 2021-11-24 VITALS — BP 140/80 | HR 70 | Temp 98.0°F | Wt 244.5 lb

## 2021-11-24 DIAGNOSIS — S29011D Strain of muscle and tendon of front wall of thorax, subsequent encounter: Secondary | ICD-10-CM | POA: Diagnosis not present

## 2021-11-24 MED ORDER — PREDNISONE 20 MG PO TABS: 40.0000 mg | ORAL_TABLET | Freq: Every day | ORAL | 0 refills | Status: DC

## 2021-11-24 NOTE — Progress Notes (Signed)
Established Patient Office Visit  Subjective:  Patient ID: Aaron Williamson, male    DOB: [birth year]  Age: 41 y.o. MRN: [id number]  CC:  Chief Complaint  Patient presents with   Follow-up    Seen at urgent care for rib pain and told to follow up with PCP    HPI Aaron Williamson presents for right chest wall pain.  He was seen at urgent care 4 days ago following pain in the right side region after doing some lifting of heavy toilets at work.  He then was shoveling at work a couple days later it seemed to exacerbate.  Denies any recent falls.  No pleuritic pain.  No cough.  No fevers or chills.  No rash.  No abdominal pain.  Patient was prescribed prednisone and Flexeril and states he is about 50% improved.  He was also told to get an elastic belt but has not been wearing this consistently.  His job is very physical.  He states his recent right-sided pain was fairly severe until he went on the prednisone and the Flexeril.  He does have CPAP.  Has recently been trying to get CPAP supplies.  We sent a order in for this recently.  He is still waiting to hear back.  He has used CPAP in the past consistently with good compliance and with good benefit.  He notices a definite difference if he does not use a CPAP.  Past Medical History:  Diagnosis Date   Allergy    Asthma     Past Surgical History:  Procedure Laterality Date   ORCHIECTOMY N/A 03/16/2017   Procedure: IRRIGATION AND DEBRIDEMENT SCROTUM, LEFT ORCHIECTOMY;  Surgeon: Ihor Gully, MD;  Location: [facility];  Service: Urology;  Laterality: N/A;    Family History  Problem Relation Age of Onset   Diabetes Mother    Arthritis Mother    Hypertension Sister    Diabetes Sister     Social History   Socioeconomic History   Marital status: Single    Spouse name: Not on file   Number of children: Not on file   Years of education: Not on file   Highest education level: 12th grade  Occupational History   Not on file  Tobacco  Use   Smoking status: Never   Smokeless tobacco: Never  Vaping Use   Vaping Use: Never used  Substance and Sexual Activity   Alcohol use: Yes    Comment: occassionaly   Drug use: No   Sexual activity: Yes  Other Topics Concern   Not on file  Social History Narrative   Not on file   Social Determinants of Health   Financial Resource Strain: Low Risk    Difficulty of Paying Living Expenses: Not hard at all  Food Insecurity: No Food Insecurity   Worried About Programme researcher, broadcasting/film/video in the Last Year: Never true   Ran Out of Food in the Last Year: Never true  Transportation Needs: No Transportation Needs   Lack of Transportation (Medical): No   Lack of Transportation (Non-Medical): No  Physical Activity: Unknown   Days of Exercise per Week: 0 days   Minutes of Exercise per Session: Not on file  Stress: No Stress Concern Present   Feeling of Stress : Not at all  Social Connections: Moderately Integrated   Frequency of Communication with Friends and Family: More than three times a week   Frequency of Social Gatherings with Friends and Family: More than three  times a week   Attends Religious Services: 1 to 4 times per year   Active Member of Clubs or Organizations: No   Attends Engineer, structuralClub or Organization Meetings: Not on file   Marital Status: Living with partner  Intimate Partner Violence: Not on file    Outpatient Medications Prior to Visit  Medication Sig Dispense Refill   albuterol (VENTOLIN HFA) 108 (90 Base) MCG/ACT inhaler Inhale 2 puffs into the lungs every 6 (six) hours as needed for wheezing or shortness of breath. 18 g 2   cyclobenzaprine (FLEXERIL) 10 MG tablet Take 1 tablet (10 mg total) by mouth at bedtime. 10 tablet 0   fluticasone (FLONASE) 50 MCG/ACT nasal spray Place 2 sprays into both nostrils daily. 16 g 1   levocetirizine (XYZAL) 5 MG tablet Take 1 tablet (5 mg total) by mouth every evening. 30 tablet 11   olopatadine (PATADAY) 0.1 % ophthalmic solution Place 1 drop  into both eyes 2 (two) times daily. 5 mL 5   meloxicam (MOBIC) 15 MG tablet Take 1 tablet (15 mg total) by mouth daily. 30 tablet 1   predniSONE (DELTASONE) 20 MG tablet Take 2 tablets (40 mg total) by mouth daily. 10 tablet 0   No facility-administered medications prior to visit.    Allergies  Allergen Reactions   Fish Allergy Swelling    ROS Review of Systems  Constitutional:  Negative for chills and fever.  Respiratory:  Negative for cough and shortness of breath.   Cardiovascular:  Negative for palpitations and leg swelling.  Gastrointestinal:  Negative for abdominal pain.     Objective:    Physical Exam Vitals reviewed.  Constitutional:      Appearance: Normal appearance.  Cardiovascular:     Rate and Rhythm: Normal rate and regular rhythm.     Heart sounds: No murmur heard. Pulmonary:     Effort: Pulmonary effort is normal.     Breath sounds: Normal breath sounds. No wheezing or rales.  Musculoskeletal:     Cervical back: Neck supple.     Comments: He has some mild right lateral rib tenderness around the fifth and sixth rib.  No visible swelling.  No ecchymosis.  Lymphadenopathy:     Cervical: No cervical adenopathy.  Neurological:     Mental Status: He is alert.    BP 140/80 (BP Location: Left Arm, Patient Position: Sitting, Cuff Size: Normal)    Pulse 70    Temp 98 F (36.7 C) (Oral)    Wt 244 lb 8 oz (110.9 kg)    SpO2 98%    BMI 30.56 kg/m  Wt Readings from Last 3 Encounters:  11/24/21 244 lb 8 oz (110.9 kg)  11/20/21 225 lb (102.1 kg)  07/14/21 241 lb (109.3 kg)     Health Maintenance Due  Topic Date Due   COVID-19 Vaccine (1) Never done   Hepatitis C Screening  Never done   INFLUENZA VACCINE  06/12/2021    There are no preventive care reminders to display for this patient.  Lab Results  Component Value Date   TSH 0.99 11/19/2016   Lab Results  Component Value Date   WBC 13.9 (H) 03/16/2017   HGB 13.0 03/16/2017   HCT 39.5 03/16/2017    MCV 87.8 03/16/2017   PLT 330 03/16/2017   Lab Results  Component Value Date   NA 134 (L) 03/16/2017   K 3.8 03/16/2017   CO2 27 03/16/2017   GLUCOSE 106 (H) 03/16/2017   BUN 7  03/16/2017   CREATININE 0.95 03/16/2017   BILITOT 0.5 11/19/2016   ALKPHOS 67 11/19/2016   AST 20 11/19/2016   ALT 24 11/19/2016   PROT 8.4 (H) 11/19/2016   ALBUMIN 4.7 11/19/2016   CALCIUM 9.0 03/16/2017   ANIONGAP 6 03/16/2017   GFR 109.94 11/19/2016   Lab Results  Component Value Date   CHOL 162 11/19/2016   Lab Results  Component Value Date   HDL 56.70 11/19/2016   Lab Results  Component Value Date   LDLCALC 88 11/19/2016   Lab Results  Component Value Date   TRIG 86.0 11/19/2016   Lab Results  Component Value Date   CHOLHDL 3 11/19/2016   Lab Results  Component Value Date   HGBA1C 5.6 11/19/2016      Assessment & Plan:   Right chest wall pain.  Suspect musculoskeletal.  Improving since starting prednisone.  We did provide 1 refill of prednisone to use if necessary.  We also wrote for light duty for no lifting over 25 pounds and no shoveling through 12-06-2021.  Do not see any clear indication for x-rays at this time but be in touch for any recurrent or worsening pain.  Meds ordered this encounter  Medications   predniSONE (DELTASONE) 20 MG tablet    Sig: Take 2 tablets (40 mg total) by mouth daily.    Dispense:  10 tablet    Refill:  0    Follow-up: No follow-ups on file.    Evelena Peat, MD

## 2022-01-19 ENCOUNTER — Ambulatory Visit: Payer: 59 | Admitting: Podiatry

## 2022-01-19 ENCOUNTER — Other Ambulatory Visit: Payer: Self-pay

## 2022-01-19 ENCOUNTER — Encounter: Payer: Self-pay | Admitting: Podiatry

## 2022-01-19 ENCOUNTER — Ambulatory Visit (INDEPENDENT_AMBULATORY_CARE_PROVIDER_SITE_OTHER): Payer: 59

## 2022-01-19 DIAGNOSIS — M722 Plantar fascial fibromatosis: Secondary | ICD-10-CM | POA: Diagnosis not present

## 2022-01-19 DIAGNOSIS — M79671 Pain in right foot: Secondary | ICD-10-CM

## 2022-01-19 MED ORDER — DICLOFENAC SODIUM 75 MG PO TBEC: 75.0000 mg | DELAYED_RELEASE_TABLET | Freq: Two times a day (BID) | ORAL | 2 refills | Status: DC

## 2022-01-19 MED ORDER — TRIAMCINOLONE ACETONIDE 10 MG/ML IJ SUSP
10.0000 mg | Freq: Once | INTRAMUSCULAR | Status: AC
  Administered 2022-01-19: 10 mg

## 2022-01-19 NOTE — Patient Instructions (Signed)
Plantar Fasciitis (Heel Spur Syndrome) °with Rehab °The plantar fascia is a fibrous, ligament-like, soft-tissue structure that spans the bottom of the foot. Plantar fasciitis is a condition that causes pain in the foot due to inflammation of the tissue. °SYMPTOMS  °Pain and tenderness on the underneath side of the foot. °Pain that worsens with standing or walking. °CAUSES  °Plantar fasciitis is caused by irritation and injury to the plantar fascia on the underneath side of the foot. Common mechanisms of injury include: °Direct trauma to bottom of the foot. °Damage to a small nerve that runs under the foot where the main fascia attaches to the heel bone. °Stress placed on the plantar fascia due to bone spurs. °RISK INCREASES WITH:  °Activities that place stress on the plantar fascia (running, jumping, pivoting, or cutting). °Poor strength and flexibility. °Improperly fitted shoes. °Tight calf muscles. °Flat feet. °Failure to warm-up properly before activity. °Obesity. °PREVENTION °Warm up and stretch properly before activity. °Allow for adequate recovery between workouts. °Maintain physical fitness: °Strength, flexibility, and endurance. °Cardiovascular fitness. °Maintain a health body weight. °Avoid stress on the plantar fascia. °Wear properly fitted shoes, including arch supports for individuals who have flat feet. ° °PROGNOSIS  °If treated properly, then the symptoms of plantar fasciitis usually resolve without surgery. However, occasionally surgery is necessary. ° °RELATED COMPLICATIONS  °Recurrent symptoms that may result in a chronic condition. °Problems of the lower back that are caused by compensating for the injury, such as limping. °Pain or weakness of the foot during push-off following surgery. °Chronic inflammation, scarring, and partial or complete fascia tear, occurring more often from repeated injections. ° °TREATMENT  °Treatment initially involves the use of ice and medication to help reduce pain and  inflammation. The use of strengthening and stretching exercises may help reduce pain with activity, especially stretches of the Achilles tendon. These exercises may be performed at home or with a therapist. Your caregiver may recommend that you use heel cups of arch supports to help reduce stress on the plantar fascia. Occasionally, corticosteroid injections are given to reduce inflammation. If symptoms persist for greater than 6 months despite non-surgical (conservative), then surgery may be recommended.  ° °MEDICATION  °If pain medication is necessary, then nonsteroidal anti-inflammatory medications, such as aspirin and ibuprofen, or other minor pain relievers, such as acetaminophen, are often recommended. °Do not take pain medication within 7 days before surgery. °Prescription pain relievers may be given if deemed necessary by your caregiver. Use only as directed and only as much as you need. °Corticosteroid injections may be given by your caregiver. These injections should be reserved for the most serious cases, because they may only be given a certain number of times. ° °HEAT AND COLD °Cold treatment (icing) relieves pain and reduces inflammation. Cold treatment should be applied for 10 to 15 minutes every 2 to 3 hours for inflammation and pain and immediately after any activity that aggravates your symptoms. Use ice packs or massage the area with a piece of ice (ice massage). °Heat treatment may be used prior to performing the stretching and strengthening activities prescribed by your caregiver, physical therapist, or athletic trainer. Use a heat pack or soak the injury in warm water. ° °SEEK IMMEDIATE MEDICAL CARE IF: °Treatment seems to offer no benefit, or the condition worsens. °Any medications produce adverse side effects. ° °EXERCISES- °RANGE OF MOTION (ROM) AND STRETCHING EXERCISES - Plantar Fasciitis (Heel Spur Syndrome) °These exercises may help you when beginning to rehabilitate your injury. Your    symptoms may resolve with or without further involvement from your physician, physical therapist or athletic trainer. While completing these exercises, remember:  °Restoring tissue flexibility helps normal motion to return to the joints. This allows healthier, less painful movement and activity. °An effective stretch should be held for at least 30 seconds. °A stretch should never be painful. You should only feel a gentle lengthening or release in the stretched tissue. ° °RANGE OF MOTION - Toe Extension, Flexion °Sit with your right / left leg crossed over your opposite knee. °Grasp your toes and gently pull them back toward the top of your foot. You should feel a stretch on the bottom of your toes and/or foot. °Hold this stretch for 10 seconds. °Now, gently pull your toes toward the bottom of your foot. You should feel a stretch on the top of your toes and or foot. °Hold this stretch for 10 seconds. °Repeat  times. Complete this stretch 3 times per day.  ° °RANGE OF MOTION - Ankle Dorsiflexion, Active Assisted °Remove shoes and sit on a chair that is preferably not on a carpeted surface. °Place right / left foot under knee. Extend your opposite leg for support. °Keeping your heel down, slide your right / left foot back toward the chair until you feel a stretch at your ankle or calf. If you do not feel a stretch, slide your bottom forward to the edge of the chair, while still keeping your heel down. °Hold this stretch for 10 seconds. °Repeat 3 times. Complete this stretch 2 times per day.  ° °STRETCH  Gastroc, Standing °Place hands on wall. °Extend right / left leg, keeping the front knee somewhat bent. °Slightly point your toes inward on your back foot. °Keeping your right / left heel on the floor and your knee straight, shift your weight toward the wall, not allowing your back to arch. °You should feel a gentle stretch in the right / left calf. Hold this position for 10 seconds. °Repeat 3 times. Complete this  stretch 2 times per day. ° °STRETCH  Soleus, Standing °Place hands on wall. °Extend right / left leg, keeping the other knee somewhat bent. °Slightly point your toes inward on your back foot. °Keep your right / left heel on the floor, bend your back knee, and slightly shift your weight over the back leg so that you feel a gentle stretch deep in your back calf. °Hold this position for 10 seconds. °Repeat 3 times. Complete this stretch 2 times per day. ° °STRETCH  Gastrocsoleus, Standing  °Note: This exercise can place a lot of stress on your foot and ankle. Please complete this exercise only if specifically instructed by your caregiver.  °Place the ball of your right / left foot on a step, keeping your other foot firmly on the same step. °Hold on to the wall or a rail for balance. °Slowly lift your other foot, allowing your body weight to press your heel down over the edge of the step. °You should feel a stretch in your right / left calf. °Hold this position for 10 seconds. °Repeat this exercise with a slight bend in your right / left knee. °Repeat 3 times. Complete this stretch 2 times per day.  ° °STRENGTHENING EXERCISES - Plantar Fasciitis (Heel Spur Syndrome)  °These exercises may help you when beginning to rehabilitate your injury. They may resolve your symptoms with or without further involvement from your physician, physical therapist or athletic trainer. While completing these exercises, remember:  °Muscles can   gain both the endurance and the strength needed for everyday activities through controlled exercises. °Complete these exercises as instructed by your physician, physical therapist or athletic trainer. Progress the resistance and repetitions only as guided. ° °STRENGTH - Towel Curls °Sit in a chair positioned on a non-carpeted surface. °Place your foot on a towel, keeping your heel on the floor. °Pull the towel toward your heel by only curling your toes. Keep your heel on the floor. °Repeat 3 times.  Complete this exercise 2 times per day. ° °STRENGTH - Ankle Inversion °Secure one end of a rubber exercise band/tubing to a fixed object (table, pole). Loop the other end around your foot just before your toes. °Place your fists between your knees. This will focus your strengthening at your ankle. °Slowly, pull your big toe up and in, making sure the band/tubing is positioned to resist the entire motion. °Hold this position for 10 seconds. °Have your muscles resist the band/tubing as it slowly pulls your foot back to the starting position. °Repeat 3 times. Complete this exercises 2 times per day.  °Document Released: 10/29/2005 Document Revised: 01/21/2012 Document Reviewed: 02/10/2009 °[employer]® Patient Information ©2014 [employer].  °

## 2022-01-21 NOTE — Progress Notes (Signed)
Subjective:  ? ?Patient ID: Aaron Williamson, male   DOB: 41 y.o.   MRN: [id number]  ? ?HPI ?Patient presents with exquisite discomfort in the plantar aspect of the right heel of 5 months duration.  Does not remember specific injury but it is difficult for him to do his job and to be ambulatory worse when he gets up in the morning ? ? ?Review of Systems  ?All other systems reviewed and are negative. ? ? ?   ?Objective:  ?Physical Exam ?Vitals and nursing note reviewed.  ?Constitutional:   ?   Appearance: He is well-developed.  ?Pulmonary:  ?   Effort: Pulmonary effort is normal.  ?Musculoskeletal:     ?   General: Normal range of motion.  ?Skin: ?   General: Skin is warm.  ?Neurological:  ?   Mental Status: He is alert.  ?  ?Neurovascular status intact muscle strength found to be adequate range of motion adequate with exquisite discomfort plantar aspect right heel at the insertional point of the tendon into the calcaneus with inflammation fluid around the medial band ? ?   ?Assessment:  ?Acute plantar fasciitis right with inflammation fluid of the medial band ? ?   ?Plan:  ?H&P x-rays reviewed sterile prep injected the plantar fascia at insertion calcaneus 3 mg Kenalog 5 mg Xylocaine applied fascial brace with all instructions on usage to support the arch and take pressure off the fascia itself and discussed physical therapy and shoe gear modifications.  Reappoint to recheck in the next several weeks ? ?X-rays indicate that there is no signs of fracture or spur formation associated with this acute pain ?   ? ? ?

## 2022-01-26 ENCOUNTER — Other Ambulatory Visit: Payer: Self-pay | Admitting: Podiatry

## 2022-01-26 DIAGNOSIS — M722 Plantar fascial fibromatosis: Secondary | ICD-10-CM

## 2022-02-09 ENCOUNTER — Ambulatory Visit: Payer: 59 | Admitting: Podiatry

## 2022-02-09 ENCOUNTER — Encounter: Payer: Self-pay | Admitting: Podiatry

## 2022-02-09 ENCOUNTER — Ambulatory Visit: Payer: 59 | Admitting: Family

## 2022-02-09 ENCOUNTER — Encounter: Payer: Self-pay | Admitting: Family

## 2022-02-09 ENCOUNTER — Ambulatory Visit (INDEPENDENT_AMBULATORY_CARE_PROVIDER_SITE_OTHER)
Admission: RE | Admit: 2022-02-09 | Discharge: 2022-02-09 | Disposition: A | Discharge status: Home / Self Care | Payer: 59 | Source: Ambulatory Visit | Attending: Family | Admitting: Family

## 2022-02-09 VITALS — BP 136/76 | HR 69 | Temp 98.1°F | Resp 16 | Ht 75.0 in | Wt 241.4 lb

## 2022-02-09 DIAGNOSIS — L989 Disorder of the skin and subcutaneous tissue, unspecified: Secondary | ICD-10-CM | POA: Diagnosis not present

## 2022-02-09 DIAGNOSIS — R12 Heartburn: Secondary | ICD-10-CM

## 2022-02-09 DIAGNOSIS — R0683 Snoring: Secondary | ICD-10-CM | POA: Diagnosis not present

## 2022-02-09 DIAGNOSIS — B370 Candidal stomatitis: Secondary | ICD-10-CM

## 2022-02-09 DIAGNOSIS — J4541 Moderate persistent asthma with (acute) exacerbation: Secondary | ICD-10-CM

## 2022-02-09 DIAGNOSIS — M722 Plantar fascial fibromatosis: Secondary | ICD-10-CM

## 2022-02-09 DIAGNOSIS — R0609 Other forms of dyspnea: Secondary | ICD-10-CM | POA: Diagnosis not present

## 2022-02-09 DIAGNOSIS — J301 Allergic rhinitis due to pollen: Secondary | ICD-10-CM

## 2022-02-09 DIAGNOSIS — G4733 Obstructive sleep apnea (adult) (pediatric): Secondary | ICD-10-CM | POA: Diagnosis not present

## 2022-02-09 MED ORDER — MONTELUKAST SODIUM 10 MG PO TABS: 10.0000 mg | ORAL_TABLET | Freq: Every day | ORAL | 3 refills | Status: DC

## 2022-02-09 MED ORDER — OMEPRAZOLE 20 MG PO CPDR: 20.0000 mg | DELAYED_RELEASE_CAPSULE | Freq: Every day | ORAL | 3 refills | Status: DC

## 2022-02-09 MED ORDER — FLUTICASONE PROPIONATE 50 MCG/ACT NA SUSP: 2.0000 | Freq: Every day | NASAL | 1 refills | Status: DC

## 2022-02-09 MED ORDER — NYSTATIN 100000 UNIT/ML MT SUSP: OROMUCOSAL | 0 refills | Status: DC

## 2022-02-09 NOTE — Assessment & Plan Note (Signed)
rx nystatin 100000 unit/ml ?Wash mouth after every use of albuterol inhaler ?

## 2022-02-09 NOTE — Assessment & Plan Note (Signed)
cxr today ordered and pending ? ?

## 2022-02-09 NOTE — Assessment & Plan Note (Signed)
Continue xyzal and flonase daily ?

## 2022-02-09 NOTE — Assessment & Plan Note (Signed)
Continue f/u with podiatry ?Inform him of possible allergic reaction to injection/prednisone ?

## 2022-02-09 NOTE — Assessment & Plan Note (Signed)
Exacerbated. ?D/w pt on goal of not using albuterol daily. ?Start rx Singulair 10 mg sent to pharmacy ?F/u 3 weeks, will add maintenance inhaler if no improvement ?Treating gerd as well to hopefully control exacerbation ?

## 2022-02-09 NOTE — Assessment & Plan Note (Signed)
Trial ppi, rx omeprazole 20 mg sent to pharmacy  ?Try to decrease and or avoid spicy foods, fried fatty foods, and also caffeine and chocolate as these can increase heartburn symptoms.  ? ?

## 2022-02-09 NOTE — Progress Notes (Signed)
Subjective:  ? ?Patient ID: Aaron Williamson, male   DOB: 41 y.o.   MRN: [id number]  ? ?HPI ?Patient states his heel is feeling much better with significant reduction of his discomfort ? ? ?ROS ? ? ?   ?Objective:  ?Physical Exam  ?Neurovascular status intact significant reduction discomfort plantar aspect right heel with pain only upon deep palpation ? ?   ?Assessment:  ?Acute fasciitis-like symptoms right improved but still slightly part present ? ?   ?Plan:  ?Reviewed at great length different treatment options and continued physical therapy is shoe gear modifications anti-inflammatories and reappoint to recheck ?   ? ? ?

## 2022-02-09 NOTE — Patient Instructions (Addendum)
Start singulair 10 mg tablet once a night in addition to your regular medications.  ?The goal is not to have to use your inhaler as often.  ? ?A referral was placed today for a sleep study.  ?[facility] ?Address: [address], [place], [place] [address] ?Phone: [phone] ? ?I have sent in nystatin for the white thrush on your tongue.  ?Make sure you wash your mouth with water after every use of albuterol.  ? ?Please start trial of omeprazole as prescribed. Take this medication for two weeks, administer 30 minutes prior to breakfast each am. Try to decrease and or avoid spicy foods, fried fatty foods, and also caffeine and chocolate as these can increase heartburn symptoms.  ? ?It was a pleasure seeing you today! Please do not hesitate to reach out with any questions and or concerns. ? ?Regards,  ? ?Oral Hallgren ?FNP-C ? ? ? ? ? ? ?

## 2022-02-09 NOTE — Assessment & Plan Note (Signed)
Referral for sleep study in place, pending ?

## 2022-02-09 NOTE — Assessment & Plan Note (Signed)
Referral placed for sleep studies ?Pt to call to make appt ?Use cpap as directed once replaced ?
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
## 2022-02-11 ENCOUNTER — Encounter: Payer: Self-pay | Admitting: Family

## 2022-02-11 DIAGNOSIS — B370 Candidal stomatitis: Secondary | ICD-10-CM

## 2022-02-12 ENCOUNTER — Telehealth: Payer: Self-pay

## 2022-02-12 MED ORDER — NYSTATIN 100000 UNIT/ML MT SUSP: OROMUCOSAL | 0 refills | Status: DC

## 2022-02-12 NOTE — Telephone Encounter (Signed)
Left message to return call to our office.  

## 2022-02-13 NOTE — Telephone Encounter (Signed)
Left message to return call to our office.  

## 2022-02-15 ENCOUNTER — Encounter: Payer: Self-pay | Admitting: Family

## 2022-02-15 ENCOUNTER — Other Ambulatory Visit: Payer: Self-pay | Admitting: Family

## 2022-02-15 DIAGNOSIS — J4541 Moderate persistent asthma with (acute) exacerbation: Secondary | ICD-10-CM

## 2022-02-15 NOTE — Progress Notes (Signed)
Referral placed.  Pt notified via mychart. 

## 2022-03-02 ENCOUNTER — Encounter: Payer: Self-pay | Admitting: Family

## 2022-03-02 ENCOUNTER — Ambulatory Visit: Payer: 59 | Admitting: Family

## 2022-03-02 ENCOUNTER — Other Ambulatory Visit: Payer: 59

## 2022-03-02 VITALS — BP 139/90 | HR 71 | Temp 98.7°F | Resp 16 | Ht 75.0 in | Wt 241.0 lb

## 2022-03-02 DIAGNOSIS — Z13220 Encounter for screening for lipoid disorders: Secondary | ICD-10-CM

## 2022-03-02 DIAGNOSIS — R5382 Chronic fatigue, unspecified: Secondary | ICD-10-CM | POA: Insufficient documentation

## 2022-03-02 DIAGNOSIS — J302 Other seasonal allergic rhinitis: Secondary | ICD-10-CM

## 2022-03-02 DIAGNOSIS — J4541 Moderate persistent asthma with (acute) exacerbation: Secondary | ICD-10-CM

## 2022-03-02 DIAGNOSIS — R779 Abnormality of plasma protein, unspecified: Secondary | ICD-10-CM

## 2022-03-02 DIAGNOSIS — L509 Urticaria, unspecified: Secondary | ICD-10-CM | POA: Diagnosis not present

## 2022-03-02 DIAGNOSIS — R12 Heartburn: Secondary | ICD-10-CM

## 2022-03-02 LAB — CBC WITH DIFFERENTIAL/PLATELET
Basophils Absolute: 0 10*3/uL (ref 0.0–0.1)
Basophils Relative: 0.4 % (ref 0.0–3.0)
Eosinophils Absolute: 0.1 10*3/uL (ref 0.0–0.7)
Eosinophils Relative: 1.7 % (ref 0.0–5.0)
HCT: 44.9 % (ref 39.0–52.0)
Hemoglobin: 14.9 g/dL (ref 13.0–17.0)
Lymphocytes Relative: 37.3 % (ref 12.0–46.0)
Lymphs Abs: 2.2 10*3/uL (ref 0.7–4.0)
MCHC: 33.2 g/dL (ref 30.0–36.0)
MCV: 89 fl (ref 78.0–100.0)
Monocytes Absolute: 0.6 10*3/uL (ref 0.1–1.0)
Monocytes Relative: 9.5 % (ref 3.0–12.0)
Neutro Abs: 3.1 10*3/uL (ref 1.4–7.7)
Neutrophils Relative %: 51.1 % (ref 43.0–77.0)
Platelets: 247 10*3/uL (ref 150.0–400.0)
RBC: 5.05 Mil/uL (ref 4.22–5.81)
RDW: 13.3 % (ref 11.5–15.5)
WBC: 6 10*3/uL (ref 4.0–10.5)

## 2022-03-02 LAB — COMPREHENSIVE METABOLIC PANEL
ALT: 24 U/L (ref 0–53)
AST: 21 U/L (ref 0–37)
Albumin: 4.8 g/dL (ref 3.5–5.2)
Alkaline Phosphatase: 66 U/L (ref 39–117)
BUN: 11 mg/dL (ref 6–23)
CO2: 28 mEq/L (ref 19–32)
Calcium: 10.3 mg/dL (ref 8.4–10.5)
Chloride: 104 mEq/L (ref 96–112)
Creatinine, Ser: 0.94 mg/dL (ref 0.40–1.50)
GFR: 102.21 mL/min (ref >60.00)
Glucose, Bld: 91 mg/dL (ref 70–99)
Potassium: 4.5 mEq/L (ref 3.5–5.1)
Sodium: 140 mEq/L (ref 135–145)
Total Bilirubin: 0.4 mg/dL (ref 0.2–1.2)
Total Protein: 8.5 g/dL — ABNORMAL HIGH (ref 6.0–8.3)

## 2022-03-02 LAB — LIPID PANEL
Cholesterol: 199 mg/dL (ref 0–200)
HDL: 63.7 mg/dL (ref >39.00)
LDL Cholesterol: 111 mg/dL — ABNORMAL HIGH (ref 0–99)
NonHDL: 135.14
Total CHOL/HDL Ratio: 3
Triglycerides: 119 mg/dL (ref 0.0–149.0)
VLDL: 23.8 mg/dL (ref 0.0–40.0)

## 2022-03-02 LAB — B12 AND FOLATE PANEL
Folate: 11.8 ng/mL (ref >5.9)
Vitamin B-12: 122 pg/mL — ABNORMAL LOW (ref 211–911)

## 2022-03-02 LAB — TSH: TSH: 0.68 u[IU]/mL (ref 0.35–5.50)

## 2022-03-02 MED ORDER — FLUTICASONE PROPIONATE HFA 44 MCG/ACT IN AERO: 2.0000 | INHALATION_SPRAY | Freq: Two times a day (BID) | RESPIRATORY_TRACT | 12 refills | Status: DC
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
## 2022-03-02 NOTE — Progress Notes (Signed)
Can we add serum protein electrophoresis? Added in future orders if able.

## 2022-03-02 NOTE — Patient Instructions (Addendum)
Start over the counter zyrtec (cetirizine) take this once a night for allergies and hives.  ? ?A referral was placed today for allergist.  ?Please let us know if you have not heard back within 2 weeks about the referral. ? ?Want you to start daily flovent inhaler.  ?Albuterol inhaler is only for in between when needed, when you feel short of breath (more of an emergency inhaler) ? ?Stop by the lab prior to leaving today. I will notify you of your results once received.  ? ?Due to recent changes in healthcare laws, you may see results of your imaging and/or laboratory studies on MyChart before I have had a chance to review them.  I understand that in some cases there may be results that are confusing or concerning to you. Please understand that not all results are received at the same time and often I may need to interpret multiple results in order to provide you with the best plan of care or course of treatment. Therefore, I ask that you please give me 2 business days to thoroughly review all your results before contacting my office for clarification. Should we see a critical lab result, you will be contacted sooner.  ? ?It was a pleasure seeing you today! Please do not hesitate to reach out with any questions and or concerns. ? ?Regards,  ? ?Danitza Schoenfeldt ?FNP-C ? ? ?

## 2022-03-05 NOTE — Progress Notes (Signed)
Work on low cholesterol diet ?B12 very low this can cause increased fatigue,  ?Vitamin B12 very low, please set up for B12 injections, 1000 mcg IM once monthly for three months, also schedule 3 month f/u appt to repeat labs and f/u in office. Recommend also oral otc 1000 mcg once daily vitamin B12 ? ?Thyroid stable.  ?I ordered a protein electrophoresis, Lab said they couldn't do it without him coming in for lab appt but when I look in the chart it says collected? Can you check into this kelly to ensure it was collected bc if it was not I want pt to come in to get lab drawn for protein electrophoresis for slight chronic elevation of protein (let me know) ?is pt taking protein shakes often? Eating foods high in protein on a regular basis? (Melon, cantaloupe, lots of meat)

## 2022-03-06 ENCOUNTER — Other Ambulatory Visit: Payer: Self-pay | Admitting: Family

## 2022-03-06 ENCOUNTER — Encounter: Payer: Self-pay | Admitting: Family

## 2022-03-06 DIAGNOSIS — R779 Abnormality of plasma protein, unspecified: Secondary | ICD-10-CM | POA: Insufficient documentation

## 2022-03-06 DIAGNOSIS — R5382 Chronic fatigue, unspecified: Secondary | ICD-10-CM

## 2022-03-06 DIAGNOSIS — J302 Other seasonal allergic rhinitis: Secondary | ICD-10-CM | POA: Insufficient documentation

## 2022-03-06 DIAGNOSIS — Z13220 Encounter for screening for lipoid disorders: Secondary | ICD-10-CM | POA: Insufficient documentation

## 2022-03-06 DIAGNOSIS — D892 Hypergammaglobulinemia, unspecified: Secondary | ICD-10-CM

## 2022-03-06 LAB — PROTEIN ELECTROPHORESIS, SERUM, WITH REFLEX
Albumin ELP: 4.8 g/dL (ref 3.8–4.8)
Alpha 1: 0.3 g/dL (ref 0.2–0.3)
Alpha 2: 0.7 g/dL (ref 0.5–0.9)
Beta 2: 0.5 g/dL (ref 0.2–0.5)
Beta Globulin: 0.5 g/dL (ref 0.4–0.6)
Gamma Globulin: 1.8 g/dL — ABNORMAL HIGH (ref 0.8–1.7)
Total Protein: 8.6 g/dL — ABNORMAL HIGH (ref 6.1–8.1)

## 2022-03-06 NOTE — Assessment & Plan Note (Signed)
Lipid panel ordered pending results.   

## 2022-03-06 NOTE — Assessment & Plan Note (Signed)
Going to start pt on daily flovent 44 mcg  ?Let's see if this helps with asthma control  ?Continue Singulair 10 mg  ?Also referring pt to allergist for workup for ongoing allergic rhinitis and urticaria symptoms ?

## 2022-03-06 NOTE — Assessment & Plan Note (Signed)
Also referring pt to allergist for workup for ongoing allergic rhinitis and urticaria symptoms ?Continue zyrtec 10 mg ?

## 2022-03-06 NOTE — Assessment & Plan Note (Signed)
Also referring pt to allergist for workup for ongoing allergic rhinitis and urticaria symptoms ?Continue singulair 10 mg ?

## 2022-03-06 NOTE — Assessment & Plan Note (Signed)
Repeating protein today for elevation. Pending results ?

## 2022-03-06 NOTE — Assessment & Plan Note (Signed)
Workup for chronic fatigue. b12 cbc cmp tsh ordered pending results ?

## 2022-03-06 NOTE — Assessment & Plan Note (Signed)
Continue omeprazole 20 mg  ?Try to decrease and or avoid spicy foods, fried fatty foods, and also caffeine and chocolate as these can increase heartburn symptoms.  ? ?

## 2022-03-14 ENCOUNTER — Ambulatory Visit (INDEPENDENT_AMBULATORY_CARE_PROVIDER_SITE_OTHER): Payer: 59 | Admitting: Primary Care

## 2022-03-14 ENCOUNTER — Ambulatory Visit (INDEPENDENT_AMBULATORY_CARE_PROVIDER_SITE_OTHER): Payer: 59

## 2022-03-14 ENCOUNTER — Encounter: Payer: Self-pay | Admitting: Primary Care

## 2022-03-14 VITALS — BP 132/74 | HR 77 | Temp 97.7°F | Ht 75.0 in | Wt 242.0 lb

## 2022-03-14 DIAGNOSIS — E538 Deficiency of other specified B group vitamins: Secondary | ICD-10-CM | POA: Diagnosis not present

## 2022-03-14 DIAGNOSIS — G4733 Obstructive sleep apnea (adult) (pediatric): Secondary | ICD-10-CM

## 2022-03-14 MED ORDER — CYANOCOBALAMIN 1000 MCG/ML IJ SOLN
1000.0000 ug | Freq: Once | INTRAMUSCULAR | Status: AC
  Administered 2022-03-14: 1000 ug via INTRAMUSCULAR
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
## 2022-03-14 NOTE — Assessment & Plan Note (Signed)
-   Hx moderate sleep apnea dx May 2020, AHI 24/hr. Currently not wearing CPAP. Unable to get supplies d/t billing issue with [employer]. Spoke with DME company, he was instructed to call their number provided to re-establish with them. He does not currently need to repeat sleep study. We placed an order to renew CPAP supplies along with mask fitting. Advised he resume CPAP once he gets new CPAP supplies. We will follow-up in 4-6 weeks for compliance check. Instructed that he bring SD card to next visit.  ?

## 2022-03-14 NOTE — Progress Notes (Signed)
Per orders of Dr. Eustaquio Boyden, injection of B12 given by Vertis Kelch in right deltoid. ?Patient tolerated injection well. Patient will make appointment for . ? ?  ?

## 2022-03-14 NOTE — Patient Instructions (Addendum)
Recommendations: ?- Resume CPAP use when you get new supplies. Make sure to change mask, tubing and filter before starting. Aim to wear every night minimum 4-6 hours. We will have you follow-up in 4-6 weeks to look at use and settings.  ?- Please call [facility] to get new supplies - [phone] ? ?Orders: ?- Renew CPAP supplies/mask fitting with [facility]  ? ?Follow-up: ?- 4-6 weeks with Waynetta Sandy NP or Tammy NP  ? ? ? ?CPAP and BIPAP Information ?CPAP and BIPAP are methods that use air pressure to keep your airways open and to help you breathe well. CPAP and BIPAP use different amounts of pressure. Your health care provider will tell you whether CPAP or BIPAP would be more helpful for you. ?CPAP stands for "continuous positive airway pressure." With CPAP, the amount of pressure stays the same while you breathe in (inhale) and out (exhale). ?BIPAP stands for "bi-level positive airway pressure." With BIPAP, the amount of pressure will be higher when you inhale and lower when you exhale. This allows you to take larger breaths. ?CPAP or BIPAP may be used in the hospital, or your health care provider may want you to use it at home. You may need to have a sleep study before your health care provider can order a machine for you to use at home. ?What are the advantages? ?CPAP or BIPAP can be helpful if you have: ?Sleep apnea. ?Chronic obstructive pulmonary disease (COPD). ?Heart failure. ?Medical conditions that cause muscle weakness, including muscular dystrophy or amyotrophic lateral sclerosis (ALS). ?Other problems that cause breathing to be shallow, weak, abnormal, or difficult. ?CPAP and BIPAP are most commonly used for obstructive sleep apnea (OSA) to keep the airways from collapsing when the muscles relax during sleep. ?What are the risks? ?Generally, this is a safe treatment. However, problems may occur, including: ?Irritated skin or skin sores if the mask does not fit properly. ?Dry or stuffy nose or nosebleeds. ?Dry  mouth. ?Feeling gassy or bloated. ?Sinus or lung infection if the equipment is not cleaned properly. ?When should CPAP or BIPAP be used? ?In most cases, the mask only needs to be worn during sleep. Generally, the mask needs to be worn throughout the night and during any daytime naps. People with certain medical conditions may also need to wear the mask at other times, such as when they are awake. Follow instructions from your health care provider about when to use the machine. ?What happens during CPAP or BIPAP? ? ?Both CPAP and BIPAP are provided by a small machine with a flexible plastic tube that attaches to a plastic mask that you wear. Air is blown through the mask into your nose or mouth. The amount of pressure that is used to blow the air can be adjusted on the machine. Your health care provider will set the pressure setting and help you find the best mask for you. ?Tips for using the mask ?Because the mask needs to be snug, some people feel trapped or closed-in (claustrophobic) when first using the mask. If you feel this way, you may need to get used to the mask. One way to do this is to hold the mask loosely over your nose or mouth and then gradually apply the mask more snugly. You can also gradually increase the amount of time that you use the mask. ?Masks are available in various types and sizes. If your mask does not fit well, talk with your health care provider about getting a different one. Some common types  of masks include: ?Full face masks, which fit over the mouth and nose. ?Nasal masks, which fit over the nose. ?Nasal pillow or prong masks, which fit into the nostrils. ?If you are using a mask that fits over your nose and you tend to breathe through your mouth, a chin strap may be applied to help keep your mouth closed. ?Use a skin barrier to protect your skin as told by your health care provider. ?Some CPAP and BIPAP machines have alarms that may sound if the mask comes off or develops a  leak. ?If you have trouble with the mask, it is very important that you talk with your health care provider about finding a way to make the mask easier to tolerate. Do not stop using the mask. There could be a negative impact on your health if you stop using the mask. ?Tips for using the machine ?Place your CPAP or BIPAP machine on a secure table or stand near an electrical outlet. ?Know where the on/off switch is on the machine. ?Follow instructions from your health care provider about how to set the pressure on your machine and when you should use it. ?Do not eat or drink while the CPAP or BIPAP machine is on. Food or fluids could get pushed into your lungs by the pressure of the CPAP or BIPAP. ?For home use, CPAP and BIPAP machines can be rented or purchased through home health care companies. Many different brands of machines are available. Renting a machine before purchasing may help you find out which particular machine works well for you. Your health insurance company may also decide which machine you may get. ?Keep the CPAP or BIPAP machine and attachments clean. Ask your health care provider for specific instructions. ?Check the humidifier if you have a dry stuffy nose or nosebleeds. Make sure it is working correctly. ?Follow these instructions at home: ?Take over-the-counter and prescription medicines only as told by your health care provider. Ask if you can take sinus medicine if your sinuses are blocked. ?Do not use any products that contain nicotine or tobacco. These products include cigarettes, chewing tobacco, and vaping devices, such as e-cigarettes. If you need help quitting, ask your health care provider. ?Keep all follow-up visits. This is important. ?Contact a health care provider if: ?You have redness or pressure sores on your head, face, mouth, or nose from the mask or head gear. ?You have trouble using the CPAP or BIPAP machine. ?You cannot tolerate wearing the CPAP or BIPAP mask. ?Someone  tells you that you snore even when wearing your CPAP or BIPAP. ?Get help right away if: ?You have trouble breathing. ?You feel confused. ?Summary ?CPAP and BIPAP are methods that use air pressure to keep your airways open and to help you breathe well. ?If you have trouble with the mask, it is very important that you talk with your health care provider about finding a way to make the mask easier to tolerate. Do not stop using the mask. There could be a negative impact to your health if you stop using the mask. ?Follow instructions from your health care provider about when to use the machine. ?This information is not intended to replace advice given to you by your health care provider. Make sure you discuss any questions you have with your health care provider. ?Document Revised: 06/07/2021 Document Reviewed: 10/07/2020 ?Elsevier Patient Education ? 2023 Elsevier Inc. ? ?

## 2022-03-14 NOTE — Progress Notes (Signed)
Reviewed and agree with assessment/plan. ? ? ?Eamonn Sermeno, MD ?[facility] ?03/14/2022, 11:42 AM ?Pager:  [phone] ? ?

## 2022-03-23 ENCOUNTER — Encounter: Payer: Self-pay | Admitting: Family

## 2022-03-30 ENCOUNTER — Inpatient Hospital Stay: Payer: 59

## 2022-03-30 ENCOUNTER — Inpatient Hospital Stay: Payer: 59 | Admitting: Oncology

## 2022-04-03 ENCOUNTER — Encounter: Payer: Self-pay | Admitting: Family

## 2022-04-06 ENCOUNTER — Other Ambulatory Visit: Payer: Self-pay | Admitting: Family

## 2022-04-06 DIAGNOSIS — R0683 Snoring: Secondary | ICD-10-CM

## 2022-04-06 DIAGNOSIS — R5382 Chronic fatigue, unspecified: Secondary | ICD-10-CM

## 2022-04-18 ENCOUNTER — Ambulatory Visit (INDEPENDENT_AMBULATORY_CARE_PROVIDER_SITE_OTHER): Payer: 59

## 2022-04-18 ENCOUNTER — Telehealth: Payer: Self-pay

## 2022-04-18 DIAGNOSIS — E538 Deficiency of other specified B group vitamins: Secondary | ICD-10-CM

## 2022-04-18 MED ORDER — CYANOCOBALAMIN 1000 MCG/ML IJ SOLN
1000.0000 ug | Freq: Once | INTRAMUSCULAR | Status: AC
  Administered 2022-04-18: 1000 ug via INTRAMUSCULAR

## 2022-04-18 NOTE — Progress Notes (Signed)
Per orders of Kate Clark, AGNP-C, injection of B-12 given by Alyson Ki in left deltoid. Patient tolerated injection well.    

## 2022-04-19 NOTE — Telephone Encounter (Signed)
Pt needs CPAP order printed and sent to [facility]  per insurance.

## 2022-04-24 NOTE — Telephone Encounter (Signed)
Left message to return call to our office.  

## 2022-05-11 ENCOUNTER — Ambulatory Visit: Payer: 59 | Admitting: Primary Care

## 2022-05-22 ENCOUNTER — Ambulatory Visit: Payer: 59

## 2022-05-29 ENCOUNTER — Other Ambulatory Visit: Payer: Self-pay

## 2022-05-29 ENCOUNTER — Ambulatory Visit (INDEPENDENT_AMBULATORY_CARE_PROVIDER_SITE_OTHER): Payer: 59 | Admitting: *Deleted

## 2022-05-29 DIAGNOSIS — G4733 Obstructive sleep apnea (adult) (pediatric): Secondary | ICD-10-CM

## 2022-05-29 DIAGNOSIS — R0683 Snoring: Secondary | ICD-10-CM

## 2022-05-29 DIAGNOSIS — E538 Deficiency of other specified B group vitamins: Secondary | ICD-10-CM | POA: Diagnosis not present

## 2022-05-29 MED ORDER — CYANOCOBALAMIN 1000 MCG/ML IJ SOLN
1000.0000 ug | Freq: Once | INTRAMUSCULAR | Status: AC
  Administered 2022-05-29: 1000 ug via INTRAMUSCULAR

## 2022-05-29 NOTE — Progress Notes (Signed)
Per orders of Mort Sawyers, NP , injection of Vitamin B12 given in right deltoid by Ileana Ladd. Patient tolerated injection well.

## 2022-06-01 ENCOUNTER — Ambulatory Visit: Payer: 59 | Admitting: Family

## 2022-06-01 ENCOUNTER — Telehealth: Payer: Self-pay

## 2022-06-01 NOTE — Telephone Encounter (Signed)
[facility] Night - Client Nonclinical Telephone Record  [facility] Client [facility] Night - Client Client Site [facility] - Night Contact Type Call Who Is Calling Patient / Member / Family / Caregiver Caller Name Carrell Rahmani Phone Number [phone] Patient Name Aaron Williamson Patient DOB [birth year] Call Type Message Only Information Provided Reason for Call Request to Reschedule Office Appointment Initial Comment Caller states he is calling to reschedule. Patient request to speak to RN No Additional Comment office hours provided. caller denied triage. Disp. Time Disposition Final User 06/01/2022 7:23:54 AM General Information Provided Yes Emeline Gins Call Closed By: Emeline Gins Transaction Date/Time: 06/01/2022 7:22:35 AM (ET   Per appt notes pt has already called and rescheduled appt.

## 2022-06-08 ENCOUNTER — Ambulatory Visit (INDEPENDENT_AMBULATORY_CARE_PROVIDER_SITE_OTHER)
Admission: RE | Admit: 2022-06-08 | Discharge: 2022-06-08 | Disposition: A | Discharge status: Home / Self Care | Payer: 59 | Source: Ambulatory Visit | Attending: Family | Admitting: Family

## 2022-06-08 ENCOUNTER — Ambulatory Visit: Payer: 59 | Admitting: Family

## 2022-06-08 ENCOUNTER — Encounter: Payer: Self-pay | Admitting: Family

## 2022-06-08 VITALS — BP 118/82 | HR 60 | Temp 98.7°F | Resp 16 | Ht 75.0 in | Wt 241.5 lb

## 2022-06-08 DIAGNOSIS — M79671 Pain in right foot: Secondary | ICD-10-CM

## 2022-06-08 DIAGNOSIS — E538 Deficiency of other specified B group vitamins: Secondary | ICD-10-CM | POA: Diagnosis not present

## 2022-06-08 DIAGNOSIS — J4541 Moderate persistent asthma with (acute) exacerbation: Secondary | ICD-10-CM

## 2022-06-08 DIAGNOSIS — M722 Plantar fascial fibromatosis: Secondary | ICD-10-CM

## 2022-06-08 DIAGNOSIS — R2241 Localized swelling, mass and lump, right lower limb: Secondary | ICD-10-CM

## 2022-06-08 DIAGNOSIS — R779 Abnormality of plasma protein, unspecified: Secondary | ICD-10-CM

## 2022-06-08 LAB — COMPREHENSIVE METABOLIC PANEL
ALT: 20 U/L (ref 0–53)
AST: 18 U/L (ref 0–37)
Albumin: 4.6 g/dL (ref 3.5–5.2)
Alkaline Phosphatase: 59 U/L (ref 39–117)
BUN: 8 mg/dL (ref 6–23)
CO2: 28 mEq/L (ref 19–32)
Calcium: 9.7 mg/dL (ref 8.4–10.5)
Chloride: 105 mEq/L (ref 96–112)
Creatinine, Ser: 1.01 mg/dL (ref 0.40–1.50)
GFR: 93.59 mL/min (ref >60.00)
Glucose, Bld: 100 mg/dL — ABNORMAL HIGH (ref 70–99)
Potassium: 4.4 mEq/L (ref 3.5–5.1)
Sodium: 140 mEq/L (ref 135–145)
Total Bilirubin: 0.3 mg/dL (ref 0.2–1.2)
Total Protein: 8.3 g/dL (ref 6.0–8.3)

## 2022-06-08 LAB — VITAMIN B12: Vitamin B-12: 452 pg/mL (ref 211–911)

## 2022-06-08 MED ORDER — MELOXICAM 7.5 MG PO TABS: 7.5000 mg | ORAL_TABLET | Freq: Every day | ORAL | 0 refills | Status: DC

## 2022-06-08 NOTE — Assessment & Plan Note (Signed)
Xray right foot today

## 2022-06-08 NOTE — Patient Instructions (Signed)
A referral was placed today for physical therapy  Please let us know if you have not heard back within 2 weeks about the referral.  Start meloxicam once daily  Right foot ray today.   Due to recent changes in healthcare laws, you may see results of your imaging and/or laboratory studies on MyChart before I have had a chance to review them.  I understand that in some cases there may be results that are confusing or concerning to you. Please understand that not all results are received at the same time and often I may need to interpret multiple results in order to provide you with the best plan of care or course of treatment. Therefore, I ask that you please give me 2 business days to thoroughly review all your results before contacting my office for clarification. Should we see a critical lab result, you will be contacted sooner.   It was a pleasure seeing you today! Please do not hesitate to reach out with any questions and or concerns.  Regards,   Mort Sawyers FNP-C

## 2022-06-08 NOTE — Assessment & Plan Note (Signed)
Ordered b12 pending results Continue otc b12

## 2022-06-08 NOTE — Assessment & Plan Note (Signed)
Continue flovent, continue singulair 10 mg  Continue xyzal nightly

## 2022-06-08 NOTE — Assessment & Plan Note (Signed)
D/w him in detail Referred for physical therapy  F/u with podiatry

## 2022-06-08 NOTE — Assessment & Plan Note (Signed)
Denies protein intake of shakes Denies increased protein food intake Repeat protein today pending results
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
## 2022-06-08 NOTE — Assessment & Plan Note (Signed)
Xray today as increased swelling Ice /heat to site Stretching Recommend and referred for physical therapy  rx meloxicam 7.5 mg once daily  F/u with podiatry as well  Handout given for exercises for plantar fascitis as well as achilles tendinitis also instructed on proper foot wraps for suppoort

## 2022-06-11 ENCOUNTER — Ambulatory Visit: Payer: 59 | Admitting: Family Medicine

## 2022-06-11 ENCOUNTER — Encounter: Payer: Self-pay | Admitting: Family Medicine

## 2022-06-11 VITALS — BP 126/80 | HR 60 | Temp 97.6°F | Ht 75.0 in | Wt 245.1 lb

## 2022-06-11 DIAGNOSIS — M722 Plantar fascial fibromatosis: Secondary | ICD-10-CM | POA: Diagnosis not present

## 2022-06-11 DIAGNOSIS — M7661 Achilles tendinitis, right leg: Secondary | ICD-10-CM | POA: Diagnosis not present

## 2022-06-11 MED ORDER — NITROGLYCERIN 0.2 MG/HR TD PT24: MEDICATED_PATCH | TRANSDERMAL | 2 refills | Status: DC

## 2022-06-11 NOTE — Progress Notes (Signed)
Aaron Williamson T. Aaron Lattin, MD, CAQ Sports Medicine [facility] [address] [place], [address]  Phone: [phone]  FAX: [phone]  Aaron Williamson - 41 y.o. male  MRN [id number]  Date of Birth: [birth year]  Date: 06/11/2022  PCP: Mort Sawyers, FNP  Referral: Mort Sawyers, FNP  Chief Complaint  Patient presents with   Foot Pain    Right   Subjective:   Aaron Williamson is a 41 y.o. very pleasant male patient with Body mass index is 30.64 kg/m. who presents with the following:  Pleasant 41 year old presents with right-sided foot pain.    The patient was seen on Friday for plantar fasciitis and foot pain with a question of some Achilles tenderness by my partner Mrs. Dugal, he presents today to discuss further with me.  Has been seeing Dr. Charlsie Merles from podiatry, and it sounds as if he did have some plantar fasciitis that responded well to basic range of motion, ice, and 1 plantar fascia injection.  He does have a calcaneal spur that is easily visualized on the plain x-ray, and I did review this with him face-to-face.  Right now, his predominant source of pain is in the Achilles region.  It is on the right side near the insertion point, he does have some mild swelling.  When he wears boots for work, these are quite uncomfortable, particular with some pressure on the back of the heel.  He has not had any accident, injury, or trauma.  No history of surgery in the affected joint.  Review of Systems is noted in the HPI, as appropriate  Objective:   BP 126/80   Pulse 60   Temp 97.6 F (36.4 C) (Oral)   Ht 6\' 3"  (1.905 m)   Wt 245 lb 2 oz (111.2 kg)   SpO2 98%   BMI 30.64 kg/m   GEN: No acute distress; alert,appropriate. PULM: Breathing comfortably in no respiratory distress PSYCH: Normally interactive.    Foot: R Echymosis: no Edema: no ROM: full LE B Gait: heel toe, non-antalgic MT pain: no Callus pattern: none Lateral  Mall: NT Medial Mall: NT Talus: NT Navicular: NT Cuboid: NT Calcaneous: NT Metatarsals: NT 5th MT: NT Phalanges: NT Achilles: PAINFUL TO PALPATE AT INSERTION ON RIGHT, SMALL NODULE Plantar Fascia: NT Fat Pad: NT Peroneals: NT Post Tib: NT Great Toe: Nml motion Ant Drawer: neg ATFL: NT CFL: NT Deltoid: NT Sensation: intact   Laboratory and Imaging Data:  Assessment and Plan:     ICD-10-CM   1. Achilles tendinitis, right leg  M76.61     2. Plantar fascia syndrome  M72.2       Pathophysiology of achilles tendinopathy reviewed.  Additional time spent on independent x-ray review and review face-to-face.  While it sounds by history as if he did have plantar fasciitis, this is not active now, and primary source of pain is the Achilles tendon.  Additionally, I have given the patient the program emphasizing eccentric overloading detailed in the instructions based on Dr. work and protocols.  Supportive footwear reviewed.   Also think, given the location that he will do well with a Tuli's cup to give some additional cushioning posteriorly.  I appreciate the opportunity to evaluate this very friendly patient. If you have any question regarding his care or prognosis, do not hesitate to ask.   Social: Right now this is limiting his ability to maximally exercise and even to walk with activities of daily  living.  I am also going to have him start a nitroglycerin protocol for his Achilles tendon.  Patient Instructions  Get the heavy-duty Tuli's heel cups   Achilles Tendon Rehab  Start easy.  It is ok if there is mild discomfort, but if there is pain, then back off how much rehab you are doing.  For achilles rehab, you basically just need to concentrate on the ankle going up and down.  Start: Calf raises while seated First lower and then raise on both feet Assist lifting with hands and then slowly lower  Begin with 3 sets of 10 repetitions Increase by 5  repetitions every 3 days  Goal is 3 sets of 30 repetitions  If feels good at 3 sets of 30 - add backpack with 5 lbs  Increase by 5 lbs per week to max of 30 lbs   Alternative: If you have weights at home, you can put a dumbbell upright on the knee of the effected heel.  Go up with assistance from your hands, and then lower slowly.  If this is easy, then change to calf lowers standing on a step.  A heel cup of any brand will elevate the heel and take stress off of the achilles tendon. I particularly like the brand called Tuli's heel cups.  They cup the back of the heel, too.  They can be hard to find unless you can order off of the computer like on [employer]. Any heel cup is ok, though, including ones you can find at any pharmacy   Nitroglycerin Protocol  Apply 1/4 nitroglycerin patch to affected area daily. Change position of patch within the affected area every 24 hours. You may experience a headache during the first 1-2 weeks of using the patch, these should subside. If you experience headaches after beginning nitroglycerin patch treatment, you may take your preferred over the counter pain reliever. Another side effect of the nitroglycerin patch is skin irritation or rash related to patch adhesive. Please notify our office if you develop more severe headaches or rash, and stop the patch. Tendon healing with nitroglycerin patch may require 12 to 24 weeks depending on the extent of injury. Men should not use if taking Viagra, Cialis, or Levitra.  Do not use if you have migraines or rosacea.     Medication Management during today's office visit: Meds ordered this encounter  Medications   nitroGLYCERIN (NITRODUR - DOSED IN MG/24 HR) 0.2 mg/hr patch    Sig: Apply 1/4 patch to affected area as directed by MD and change every 24 hours.    Dispense:  8 patch    Refill:  2   There are no discontinued medications.  Orders placed today for conditions managed today: No orders of the defined  types were placed in this encounter.   Follow-up if needed: No follow-ups on file.  [employer] One speech-to-text software was used for transcription in this dictation.  Possible transcriptional errors can occur using Animal nutritionist.   Signed,  Elpidio Galea. Aarthi Uyeno, MD   Outpatient Encounter Medications as of 06/11/2022  Medication Sig   albuterol (VENTOLIN HFA) 108 (90 Base) MCG/ACT inhaler Inhale 2 puffs into the lungs every 6 (six) hours as needed for wheezing or shortness of breath.   fluticasone (FLONASE) 50 MCG/ACT nasal spray Place 2 sprays into both nostrils daily.   fluticasone (FLOVENT HFA) 44 MCG/ACT inhaler Inhale 2 puffs into the lungs in the morning and at bedtime.   levocetirizine (XYZAL) 5 MG  tablet Take 1 tablet (5 mg total) by mouth every evening.   meloxicam (MOBIC) 7.5 MG tablet Take 1 tablet (7.5 mg total) by mouth daily.   montelukast (SINGULAIR) 10 MG tablet Take 1 tablet (10 mg total) by mouth at bedtime.   Multiple Vitamin (MULTIVITAMIN) tablet Take 1 tablet by mouth daily.   nitroGLYCERIN (NITRODUR - DOSED IN MG/24 HR) 0.2 mg/hr patch Apply 1/4 patch to affected area as directed by MD and change every 24 hours.   nystatin (MYCOSTATIN) 100000 UNIT/ML suspension Take 5 ml by mouth, retain in mouth for 30 seconds then spit out/do not swallow/ qid for 7 days   No facility-administered encounter medications on file as of 06/11/2022.

## 2022-06-11 NOTE — Patient Instructions (Signed)
Get the heavy-duty Tuli's heel cups   Achilles Tendon Rehab  Start easy.  It is ok if there is mild discomfort, but if there is pain, then back off how much rehab you are doing.  For achilles rehab, you basically just need to concentrate on the ankle going up and down.  Start: Calf raises while seated First lower and then raise on both feet Assist lifting with hands and then slowly lower  Begin with 3 sets of 10 repetitions Increase by 5 repetitions every 3 days  Goal is 3 sets of 30 repetitions  If feels good at 3 sets of 30 - add backpack with 5 lbs  Increase by 5 lbs per week to max of 30 lbs   Alternative: If you have weights at home, you can put a dumbbell upright on the knee of the effected heel.  Go up with assistance from your hands, and then lower slowly.  If this is easy, then change to calf lowers standing on a step.  A heel cup of any brand will elevate the heel and take stress off of the achilles tendon. I particularly like the brand called Tuli's heel cups.  They cup the back of the heel, too.  They can be hard to find unless you can order off of the computer like on [employer]. Any heel cup is ok, though, including ones you can find at any pharmacy   Nitroglycerin Protocol  Apply 1/4 nitroglycerin patch to affected area daily. Change position of patch within the affected area every 24 hours. You may experience a headache during the first 1-2 weeks of using the patch, these should subside. If you experience headaches after beginning nitroglycerin patch treatment, you may take your preferred over the counter pain reliever. Another side effect of the nitroglycerin patch is skin irritation or rash related to patch adhesive. Please notify our office if you develop more severe headaches or rash, and stop the patch. Tendon healing with nitroglycerin patch may require 12 to 24 weeks depending on the extent of injury. Men should not use if taking Viagra, Cialis, or Levitra.   Do not use if you have migraines or rosacea.

## 2022-06-22 ENCOUNTER — Ambulatory Visit: Payer: 59 | Admitting: Podiatry

## 2022-06-29 ENCOUNTER — Ambulatory Visit: Payer: 59 | Admitting: Podiatry

## 2022-07-12 ENCOUNTER — Ambulatory Visit: Payer: 59 | Admitting: Podiatry

## 2022-07-13 ENCOUNTER — Ambulatory Visit: Payer: 59 | Admitting: Primary Care

## 2022-12-14 ENCOUNTER — Ambulatory Visit: Payer: 59 | Admitting: Family

## 2022-12-14 ENCOUNTER — Encounter: Payer: Self-pay | Admitting: Family

## 2022-12-14 VITALS — BP 128/78 | HR 65 | Temp 98.6°F | Ht 75.0 in | Wt 246.2 lb

## 2022-12-14 DIAGNOSIS — G8929 Other chronic pain: Secondary | ICD-10-CM

## 2022-12-14 DIAGNOSIS — J4520 Mild intermittent asthma, uncomplicated: Secondary | ICD-10-CM | POA: Diagnosis not present

## 2022-12-14 DIAGNOSIS — J301 Allergic rhinitis due to pollen: Secondary | ICD-10-CM | POA: Diagnosis not present

## 2022-12-14 DIAGNOSIS — M5441 Lumbago with sciatica, right side: Secondary | ICD-10-CM

## 2022-12-14 DIAGNOSIS — G4733 Obstructive sleep apnea (adult) (pediatric): Secondary | ICD-10-CM

## 2022-12-14 DIAGNOSIS — R779 Abnormality of plasma protein, unspecified: Secondary | ICD-10-CM | POA: Diagnosis not present

## 2022-12-14 DIAGNOSIS — E538 Deficiency of other specified B group vitamins: Secondary | ICD-10-CM

## 2022-12-14 DIAGNOSIS — J302 Other seasonal allergic rhinitis: Secondary | ICD-10-CM

## 2022-12-14 DIAGNOSIS — R7989 Other specified abnormal findings of blood chemistry: Secondary | ICD-10-CM | POA: Diagnosis not present

## 2022-12-14 DIAGNOSIS — M5442 Lumbago with sciatica, left side: Secondary | ICD-10-CM

## 2022-12-14 LAB — BASIC METABOLIC PANEL
BUN: 10 mg/dL (ref 6–23)
CO2: 25 mEq/L (ref 19–32)
Calcium: 10.1 mg/dL (ref 8.4–10.5)
Chloride: 104 mEq/L (ref 96–112)
Creatinine, Ser: 0.99 mg/dL (ref 0.40–1.50)
GFR: 95.52 mL/min (ref >60.00)
Glucose, Bld: 95 mg/dL (ref 70–99)
Potassium: 4.2 mEq/L (ref 3.5–5.1)
Sodium: 140 mEq/L (ref 135–145)

## 2022-12-14 LAB — VITAMIN B12: Vitamin B-12: 158 pg/mL — ABNORMAL LOW (ref 211–911)

## 2022-12-14 LAB — TSH: TSH: 0.84 u[IU]/mL (ref 0.35–5.50)

## 2022-12-14 MED ORDER — CYCLOBENZAPRINE HCL 10 MG PO TABS: 10.0000 mg | ORAL_TABLET | Freq: Three times a day (TID) | ORAL | 0 refills | Status: DC | PRN

## 2022-12-14 MED ORDER — FLUTICASONE PROPIONATE 50 MCG/ACT NA SUSP: 2.0000 | Freq: Every day | NASAL | 6 refills | Status: DC

## 2022-12-14 MED ORDER — IBUPROFEN 600 MG PO TABS: 600.0000 mg | ORAL_TABLET | Freq: Three times a day (TID) | ORAL | 0 refills | Status: AC | PRN

## 2022-12-14 NOTE — Assessment & Plan Note (Signed)
Flexeril prn for flares  Ibpurofen/tylenol prn flares Handout for sciatica exercises sent to pt MyChart If ongoing and or worsening pain can order lumbar spine xray

## 2022-12-14 NOTE — Assessment & Plan Note (Signed)
Stable. 

## 2022-12-14 NOTE — Assessment & Plan Note (Signed)
Continue with nightly cpap

## 2022-12-14 NOTE — Progress Notes (Signed)
Established Patient Office Visit  Subjective:      CC:  Chief Complaint  Patient presents with   Medical Management of Chronic Issues    HPI: Aaron Williamson is a 42 y.o. male presenting on 12/14/2022 for Medical Management of Chronic Issues .   New complaints:  C/o back pain, in October ongoing middle back pain. Feels the pain down posterior legs in the back. Doesn't recall a specific injury to back. The pain is intermittent, does not have today however when it does hurt does seem to associate with the rain. Most recent episode was four days ago, since resolved. Worse with standing. Takes ibuprofen with some relief.   Urticaria: has since resolved.    Social history:  Relevant past medical, surgical, family and social history reviewed and updated as indicated. Interim medical history since our last visit reviewed.  Allergies and medications reviewed and updated.  DATA REVIEWED: CHART IN EPIC     ROS: Negative unless specifically indicated above in HPI.    Current Outpatient Medications:    cyclobenzaprine (FLEXERIL) 10 MG tablet, Take 1 tablet (10 mg total) by mouth 3 (three) times daily as needed for muscle spasms., Disp: 30 tablet, Rfl: 0   fluticasone (FLONASE) 50 MCG/ACT nasal spray, Place 2 sprays into both nostrils daily., Disp: 16 g, Rfl: 6   ibuprofen (ADVIL) 600 MG tablet, Take 1 tablet (600 mg total) by mouth every 8 (eight) hours as needed., Disp: 30 tablet, Rfl: 0      Objective:    BP 128/78   Pulse 65   Temp 98.6 F (37 C) (Temporal)   Ht 6\' 3"  (1.905 m)   Wt 246 lb 3.2 oz (111.7 kg)   SpO2 98%   BMI 30.77 kg/m   Wt Readings from Last 3 Encounters:  12/14/22 246 lb 3.2 oz (111.7 kg)  06/11/22 245 lb 2 oz (111.2 kg)  06/08/22 241 lb 8 oz (109.5 kg)    Physical Exam Vitals reviewed.  Constitutional:      General: He is not in acute distress.    Appearance: Normal appearance. He is obese. He is not ill-appearing, toxic-appearing or  diaphoretic.  HENT:     Head: Normocephalic.     Right Ear: Tympanic membrane normal.     Left Ear: Tympanic membrane normal.     Ears:     Comments: Mild erythema right ear canal     Nose: Nose normal.     Right Turbinates: Enlarged and swollen.     Left Turbinates: Enlarged and swollen.     Mouth/Throat:     Mouth: Mucous membranes are moist.     Pharynx: Posterior oropharyngeal erythema present.  Eyes:     Pupils: Pupils are equal, round, and reactive to light.  Cardiovascular:     Rate and Rhythm: Normal rate and regular rhythm.  Pulmonary:     Effort: Pulmonary effort is normal.     Breath sounds: Normal breath sounds. No wheezing.  Musculoskeletal:        General: Normal range of motion.     Cervical back: Normal range of motion.  Neurological:     General: No focal deficit present.     Mental Status: He is alert and oriented to person, place, and time. Mental status is at baseline.  Psychiatric:        Mood and Affect: Mood normal.        Behavior: Behavior normal.  Thought Content: Thought content normal.        Judgment: Judgment normal.           Assessment & Plan:  Allergic rhinitis due to pollen, unspecified seasonality Assessment & Plan: Stable.    Mild intermittent asthma without complication Assessment & Plan: stable   OSA (obstructive sleep apnea) Assessment & Plan: Continue with nightly cpap    Seasonal allergic rhinitis, unspecified trigger Assessment & Plan: Mild flare up on physical exam  Start rx Flonase 50 mcg once daily and nightly zyrtec   Orders: -     Fluticasone Propionate; Place 2 sprays into both nostrils daily.  Dispense: 16 g; Refill: 6  Low TSH level -     TSH  Vitamin B12 deficiency -     Vitamin B12  Elevated blood protein Assessment & Plan: Stable last visit, will repeat today.   Orders: -     Basic metabolic panel  Chronic bilateral low back pain with bilateral sciatica Assessment & Plan: Flexeril  prn for flares  Ibpurofen/tylenol prn flares Handout for sciatica exercises sent to pt MyChart If ongoing and or worsening pain can order lumbar spine xray  Orders: -     Cyclobenzaprine HCl; Take 1 tablet (10 mg total) by mouth 3 (three) times daily as needed for muscle spasms.  Dispense: 30 tablet; Refill: 0 -     Ibuprofen; Take 1 tablet (600 mg total) by mouth every 8 (eight) hours as needed.  Dispense: 30 tablet; Refill: 0     Return in about 1 year (around 12/15/2023) for f/u CPE.  Eugenia Pancoast, MSN, APRN, FNP-C [facility]

## 2022-12-14 NOTE — Assessment & Plan Note (Signed)
stable °

## 2022-12-14 NOTE — Assessment & Plan Note (Addendum)
Mild flare up on physical exam  Start rx Flonase 50 mcg once daily and nightly zyrtec

## 2022-12-14 NOTE — Patient Instructions (Addendum)
------------------------------------   Recommend daily flonase and also zyrtec at night for allergies.    ------------------------------------  During sciatica flares Recommend tylenol 500 mg and ibuprofen 600 mg together for pain every 6-8 hours.  Flexeril muscle relaxer to take as needed at night time  Heat to site, lidocaine patches as needed.  Exercises to area as tolerated.   ------------------------------------  Stop by the lab prior to leaving today. I will notify you of your results once received. Marland Kitchen   ------------------------------------  Regards,   Eugenia Pancoast FNP-C

## 2022-12-14 NOTE — Assessment & Plan Note (Signed)
Stable last visit, will repeat today.

## 2022-12-17 NOTE — Progress Notes (Signed)
Vitamin B12 very low, please set up for B12 injections, 1000 mcg IM once monthly for three months, also schedule 3 month f/u appt to repeat labs and f/u in office. Recommend also oral otc 1000 mcg once daily vitamin B12  Otherwise labs stable.

## 2022-12-26 ENCOUNTER — Ambulatory Visit (INDEPENDENT_AMBULATORY_CARE_PROVIDER_SITE_OTHER): Payer: 59

## 2022-12-26 DIAGNOSIS — E538 Deficiency of other specified B group vitamins: Secondary | ICD-10-CM

## 2022-12-26 MED ORDER — CYANOCOBALAMIN 1000 MCG/ML IJ SOLN
1000.0000 ug | Freq: Once | INTRAMUSCULAR | Status: AC
  Administered 2022-12-26: 1000 ug via INTRAMUSCULAR

## 2022-12-26 NOTE — Progress Notes (Signed)
Per orders of Tabitha Dugal, NP,#1 of 3 monthly injection of B12 1000 mcg/ml given by Pilar Grammes, CMA in Right Deltoid. Patient tolerated injection well.  Sent to [employer], NP in [place] absence this afternoon.

## 2023-01-29 ENCOUNTER — Ambulatory Visit: Payer: 59

## 2023-01-30 ENCOUNTER — Ambulatory Visit (INDEPENDENT_AMBULATORY_CARE_PROVIDER_SITE_OTHER): Payer: 59

## 2023-01-30 DIAGNOSIS — E538 Deficiency of other specified B group vitamins: Secondary | ICD-10-CM

## 2023-01-30 MED ORDER — CYANOCOBALAMIN 1000 MCG/ML IJ SOLN
1000.0000 ug | Freq: Once | INTRAMUSCULAR | Status: AC
  Administered 2023-01-30: 1000 ug via INTRAMUSCULAR

## 2023-01-30 NOTE — Progress Notes (Signed)
Per orders of  Tabitha Dugal FNP, injection of Vitamin B 12 given in left deltoid given by Nathania Waldman. Patient tolerated injection well.  

## 2023-03-06 ENCOUNTER — Ambulatory Visit: Payer: 59

## 2023-03-07 ENCOUNTER — Ambulatory Visit (INDEPENDENT_AMBULATORY_CARE_PROVIDER_SITE_OTHER): Payer: 59

## 2023-03-07 DIAGNOSIS — E538 Deficiency of other specified B group vitamins: Secondary | ICD-10-CM | POA: Diagnosis not present

## 2023-03-07 MED ORDER — CYANOCOBALAMIN 1000 MCG/ML IJ SOLN
1000.0000 ug | Freq: Once | INTRAMUSCULAR | Status: AC
  Administered 2023-03-07: 1000 ug via INTRAMUSCULAR

## 2023-03-07 NOTE — Progress Notes (Signed)
Per orders of Mort Sawyers, FNP, injection of vit B12 given by Nanci Pina. Patient tolerated injection well.

## 2023-03-15 ENCOUNTER — Ambulatory Visit: Payer: 59 | Admitting: Family

## 2023-03-15 ENCOUNTER — Encounter: Payer: Self-pay | Admitting: Family

## 2023-03-15 VITALS — BP 128/82 | HR 78 | Temp 97.8°F | Wt 245.2 lb

## 2023-03-15 DIAGNOSIS — E538 Deficiency of other specified B group vitamins: Secondary | ICD-10-CM | POA: Diagnosis not present

## 2023-03-15 DIAGNOSIS — R739 Hyperglycemia, unspecified: Secondary | ICD-10-CM | POA: Diagnosis not present

## 2023-03-15 DIAGNOSIS — M7731 Calcaneal spur, right foot: Secondary | ICD-10-CM | POA: Diagnosis not present

## 2023-03-15 DIAGNOSIS — R779 Abnormality of plasma protein, unspecified: Secondary | ICD-10-CM | POA: Diagnosis not present

## 2023-03-15 DIAGNOSIS — G4733 Obstructive sleep apnea (adult) (pediatric): Secondary | ICD-10-CM

## 2023-03-15 DIAGNOSIS — R7989 Other specified abnormal findings of blood chemistry: Secondary | ICD-10-CM

## 2023-03-15 DIAGNOSIS — J302 Other seasonal allergic rhinitis: Secondary | ICD-10-CM | POA: Diagnosis not present

## 2023-03-15 LAB — BASIC METABOLIC PANEL
BUN: 10 mg/dL (ref 6–23)
CO2: 28 mEq/L (ref 19–32)
Calcium: 10 mg/dL (ref 8.4–10.5)
Chloride: 101 mEq/L (ref 96–112)
Creatinine, Ser: 1.01 mg/dL (ref 0.40–1.50)
GFR: 93.09 mL/min (ref >60.00)
Glucose, Bld: 112 mg/dL — ABNORMAL HIGH (ref 70–99)
Potassium: 4.3 mEq/L (ref 3.5–5.1)
Sodium: 137 mEq/L (ref 135–145)

## 2023-03-15 LAB — B12 AND FOLATE PANEL
Folate: 12.9 ng/mL (ref >5.9)
Vitamin B-12: 513 pg/mL (ref 211–911)

## 2023-03-15 LAB — HEMOGLOBIN A1C: Hgb A1c MFr Bld: 6 % (ref 4.6–6.5)

## 2023-03-15 MED ORDER — MELOXICAM 7.5 MG PO TABS
7.5000 mg | ORAL_TABLET | Freq: Every day | ORAL | 0 refills | Status: DC
Start: 2023-03-15 — End: 2023-06-26

## 2023-03-15 NOTE — Progress Notes (Signed)
kl

## 2023-03-15 NOTE — Patient Instructions (Addendum)
  Recommend ortho inserts.  ------------------------------------ Recommend daily flonase and also zyrtec at night for allergies.  Get some pataday allergy eye drops  ------------------------------------    Regards,   Mort Sawyers FNP-C

## 2023-03-15 NOTE — Assessment & Plan Note (Addendum)
Neuralgia top of foot, low suspicion for mortons neuroma per foot exam.  Does have calcaneal spur evidenced on xray 9/23 and has seen podiatry in the fall 2023 for plantar fascitis, curious if this is over accomodation due to pain. Trial meloxicam, advised to wear daily inserts, as well as work on foot exercises regularly. Did sent pt to podiatry for second opinion from prior podiatrist per pt request.  B12 being retested today to r/o this as reason for neuralgia. Low suspicion for circulatory as good pulses on exam.

## 2023-03-15 NOTE — Assessment & Plan Note (Signed)
Restart zyrtec flonase and pataday drops otc for allergy symptoms

## 2023-03-15 NOTE — Assessment & Plan Note (Signed)
Continue on cpap

## 2023-03-15 NOTE — Progress Notes (Signed)
Established Patient Office Visit  Subjective:      CC:  Chief Complaint  Patient presents with   Medical Management of Chronic Issues    HPI: Aaron Williamson is a 42 y.o. male presenting on 03/15/2023 for Medical Management of Chronic Issues . Vitamin B12 def: has completed six shots of b12 monthly. Will repeat today.  Taking otc b12 1000 mcg once daily.  Lab Results  Component Value Date   VITAMINB12 158 (L) 12/14/2022   Pollen: redness of eyes has not tried anything over the counter. Also itchy.   Elevated protein, not taking protein shakes. Protein electrophoresis unremarkable for concerning findings.   Chronic back pain, no recent flares however flexeril prn. Has not had to use in some time.   Acute concerns:  In the am it feels as though his right leg is asleep. Going on for about two months. Few times a week this occurs. The numbness really only on the top of the foot. No numbness on the leg, foot only. Does wear steel toe boots at work however doesn't have the numbness on the left side.      Social history:  Relevant past medical, surgical, family and social history reviewed and updated as indicated. Interim medical history since our last visit reviewed.  Allergies and medications reviewed and updated.  DATA REVIEWED: CHART IN EPIC     ROS: Negative unless specifically indicated above in HPI.    Current Outpatient Medications:    cyclobenzaprine (FLEXERIL) 10 MG tablet, Take 1 tablet (10 mg total) by mouth 3 (three) times daily as needed for muscle spasms., Disp: 30 tablet, Rfl: 0   ibuprofen (ADVIL) 600 MG tablet, Take 1 tablet (600 mg total) by mouth every 8 (eight) hours as needed., Disp: 30 tablet, Rfl: 0   meloxicam (MOBIC) 7.5 MG tablet, Take 1 tablet (7.5 mg total) by mouth daily., Disp: 30 tablet, Rfl: 0      Objective:    BP 128/82   Pulse 78   Temp 97.8 F (36.6 C) (Temporal)   Wt 245 lb 3.2 oz (111.2 kg)   SpO2 98%   BMI 30.65 kg/m    Wt Readings from Last 3 Encounters:  03/15/23 245 lb 3.2 oz (111.2 kg)  12/14/22 246 lb 3.2 oz (111.7 kg)  06/11/22 245 lb 2 oz (111.2 kg)    Physical Exam Vitals reviewed.  Constitutional:      General: He is not in acute distress.    Appearance: Normal appearance. He is obese. He is not ill-appearing, toxic-appearing or diaphoretic.  HENT:     Head: Normocephalic.     Right Ear: Tympanic membrane normal.     Left Ear: Tympanic membrane normal.     Nose: Nose normal.     Mouth/Throat:     Mouth: Mucous membranes are moist.     Pharynx: Posterior oropharyngeal erythema present.  Eyes:     Conjunctiva/sclera:     Right eye: Right conjunctiva is injected.     Left eye: Left conjunctiva is injected.     Pupils: Pupils are equal, round, and reactive to light.  Cardiovascular:     Rate and Rhythm: Normal rate and regular rhythm.     Pulses: Normal pulses.  Pulmonary:     Effort: Pulmonary effort is normal.     Breath sounds: Normal breath sounds. No wheezing.  Musculoskeletal:        General: Normal range of motion.     Cervical back:  Normal range of motion.     Right lower leg: No edema.     Left lower leg: No edema.     Right foot: Normal range of motion.  Neurological:     General: No focal deficit present.     Mental Status: He is alert and oriented to person, place, and time. Mental status is at baseline.  Psychiatric:        Mood and Affect: Mood normal.        Behavior: Behavior normal.        Thought Content: Thought content normal.        Judgment: Judgment normal.            Assessment & Plan:  Calcaneal spur of foot, right Assessment & Plan: Neuralgia top of foot, low suspicion for mortons neuroma per foot exam.  Does have calcaneal spur evidenced on xray 9/23 and has seen podiatry in the fall 2023 for plantar fascitis, curious if this is over accomodation due to pain. Trial meloxicam, advised to wear daily inserts, as well as work on foot exercises  regularly. Did sent pt to podiatry for second opinion from prior podiatrist per pt request.  B12 being retested today to r/o this as reason for neuralgia. Low suspicion for circulatory as good pulses on exam.   Orders: -     Ambulatory referral to Podiatry -     Meloxicam; Take 1 tablet (7.5 mg total) by mouth daily.  Dispense: 30 tablet; Refill: 0  OSA (obstructive sleep apnea) Assessment & Plan: Continue on cpap    Seasonal allergic rhinitis, unspecified trigger Assessment & Plan: Restart zyrtec flonase and pataday drops otc for allergy symptoms    Elevated blood protein -     Basic metabolic panel  B12 deficiency -     B12 and Folate Panel -     Methylmalonic acid, serum  Hyperglycemia -     Hemoglobin A1c     Return in about 1 year (around 03/14/2024) for f/u CPE.  Mort Sawyers, MSN, APRN, FNP-C [facility]

## 2023-03-18 LAB — METHYLMALONIC ACID, SERUM: Methylmalonic Acid, Quant: 60 nmol/L — ABNORMAL LOW (ref 87–318)

## 2023-03-18 NOTE — Addendum Note (Signed)
Addended by: Mort Sawyers on: 03/18/2023 01:59 PM   Modules accepted: Orders

## 2023-06-26 ENCOUNTER — Ambulatory Visit (INDEPENDENT_AMBULATORY_CARE_PROVIDER_SITE_OTHER)
Admission: RE | Admit: 2023-06-26 | Discharge: 2023-06-26 | Disposition: A | Discharge status: Home / Self Care | Payer: 59 | Source: Ambulatory Visit | Attending: Family | Admitting: Family

## 2023-06-26 ENCOUNTER — Ambulatory Visit: Payer: 59 | Admitting: Family

## 2023-06-26 ENCOUNTER — Encounter: Payer: Self-pay | Admitting: *Deleted

## 2023-06-26 ENCOUNTER — Encounter: Payer: Self-pay | Admitting: Family

## 2023-06-26 VITALS — BP 128/72 | HR 63 | Temp 97.7°F | Ht 75.0 in | Wt 243.0 lb

## 2023-06-26 DIAGNOSIS — M25561 Pain in right knee: Secondary | ICD-10-CM | POA: Diagnosis not present

## 2023-06-26 DIAGNOSIS — S8991XA Unspecified injury of right lower leg, initial encounter: Secondary | ICD-10-CM

## 2023-06-26 NOTE — Patient Instructions (Addendum)
  A referral was placed today orthopedist.  Please let us know if you have not heard back within 2 weeks about the referral.  Recommend tylenol 500 mg and ibuprofen 600 mg together for pain every 6-8 hours.  Heat to site, lidocaine patches as needed.  Exercises to area as tolerated.  You can use voltaren gel if needed.

## 2023-06-26 NOTE — Assessment & Plan Note (Signed)
Right knee xray to r/o acute findings. Referral placed to orthopedist for MCL laxity and tenderness Advised to apply ice throughout the day, rest, elevate.  Wear compression sleeve for support  Ibuprofen/tylenol prn pain

## 2023-06-26 NOTE — Progress Notes (Signed)
Established Patient Office Visit  Subjective:   Patient ID: Aaron Williamson, male    DOB: October 11, 1981  Age: 42 y.o. MRN: [id number]  CC:  Chief Complaint  Patient presents with   Knee Pain    Right knee x 1 week.     HPI: Aaron Williamson is a 42 y.o. male presenting on 06/26/2023 for Knee Pain (Right knee x 1 week. )  Playing basketball ten days ago with his kids, and fell landing on his right knee. He did notice swelling for one day, but has since decreased with swelling. No bruising. When squatting down or bending knee he will feel on bil sides of knee. He feels the pain as a constant throb, rubs it for some relief. Taking some ibuprofen without much relief.   A few nights a week he comes home and ices it just not daily.   Wore a compression brace but after waking up still with pain. He is standing all day and walking at work, which has been causing more pain.   Wt Readings from Last 3 Encounters:  06/26/23 243 lb (110.2 kg)  03/15/23 245 lb 3.2 oz (111.2 kg)  12/14/22 246 lb 3.2 oz (111.7 kg)          ROS: Negative unless specifically indicated above in HPI.   Relevant past medical history reviewed and updated as indicated.   Allergies and medications reviewed and updated.   Current Outpatient Medications:    ibuprofen (ADVIL) 600 MG tablet, Take 1 tablet (600 mg total) by mouth every 8 (eight) hours as needed., Disp: 30 tablet, Rfl: 0  Allergies  Allergen Reactions   Fish Allergy Swelling    Objective:   BP 128/72   Pulse 63   Temp 97.7 F (36.5 C) (Oral)   Ht 6\' 3"  (1.905 m)   Wt 243 lb (110.2 kg)   SpO2 96%   BMI 30.37 kg/m    Physical Exam Constitutional:      General: He is not in acute distress.    Appearance: Normal appearance. He is normal weight. He is not ill-appearing, toxic-appearing or diaphoretic.  Cardiovascular:     Rate and Rhythm: Normal rate and regular rhythm.  Pulmonary:     Effort: Pulmonary effort is normal.      Breath sounds: Normal breath sounds.  Musculoskeletal:     Right knee: Swelling and effusion (inferior patella) present. Decreased range of motion (pain with flexion). MCL laxity (tenderness on palpation) present.  Neurological:     General: No focal deficit present.     Mental Status: He is alert and oriented to person, place, and time. Mental status is at baseline.  Psychiatric:        Mood and Affect: Mood normal.        Behavior: Behavior normal.        Thought Content: Thought content normal.        Judgment: Judgment normal.     Assessment & Plan:  Right knee injury, initial encounter -     DG Knee Complete 4 Views Right; Future -     Ambulatory referral to Orthopedic Surgery  Acute pain of right knee Assessment & Plan: Right knee xray to r/o acute findings. Referral placed to orthopedist for MCL laxity and tenderness Advised to apply ice throughout the day, rest, elevate.  Wear compression sleeve for support  Ibuprofen/tylenol prn pain       Follow up plan: Return if symptoms worsen or fail  to improve.  Mort Sawyers, FNP

## 2023-07-14 ENCOUNTER — Emergency Department (HOSPITAL_COMMUNITY)
Admission: EM | Admit: 2023-07-14 | Discharge: 2023-07-14 | Discharge status: Against Medical Advice | Payer: 59 | Attending: Emergency Medicine | Admitting: Emergency Medicine

## 2023-07-14 ENCOUNTER — Encounter (HOSPITAL_COMMUNITY): Payer: Self-pay | Admitting: Emergency Medicine

## 2023-07-14 ENCOUNTER — Other Ambulatory Visit: Payer: Self-pay

## 2023-07-14 DIAGNOSIS — M7918 Myalgia, other site: Secondary | ICD-10-CM | POA: Diagnosis present

## 2023-07-14 DIAGNOSIS — Z5321 Procedure and treatment not carried out due to patient leaving prior to being seen by health care provider: Secondary | ICD-10-CM | POA: Insufficient documentation

## 2023-07-14 DIAGNOSIS — R519 Headache, unspecified: Secondary | ICD-10-CM | POA: Diagnosis not present

## 2023-07-14 DIAGNOSIS — Z20822 Contact with and (suspected) exposure to covid-19: Secondary | ICD-10-CM | POA: Diagnosis not present

## 2023-07-14 DIAGNOSIS — R6883 Chills (without fever): Secondary | ICD-10-CM | POA: Insufficient documentation

## 2023-07-14 DIAGNOSIS — R5383 Other fatigue: Secondary | ICD-10-CM | POA: Insufficient documentation

## 2023-07-14 LAB — RESP PANEL BY RT-PCR (RSV, FLU A&B, COVID)  RVPGX2
Influenza A by PCR: NEGATIVE
Influenza B by PCR: NEGATIVE
Resp Syncytial Virus by PCR: NEGATIVE
SARS Coronavirus 2 by RT PCR: NEGATIVE

## 2023-07-14 NOTE — ED Notes (Signed)
Patient called multiple times form lobby for room placement.  No answer x 3.  Patient did not informed staff member that he was leaving.

## 2023-07-14 NOTE — ED Triage Notes (Signed)
Patient coming to ED for evaluation of generalized body aches, chills, HA,  and fatigue.  States symptoms started tonight while at the [employer].  No fever on arrival.  Took OTC medications for HA with slight improvement.  No known sick contacts

## 2023-07-14 NOTE — ED Notes (Signed)
Patient called for room placement x 1.  No answer from lobby

## 2023-11-11 ENCOUNTER — Ambulatory Visit: Payer: Self-pay | Admitting: Family

## 2023-11-11 NOTE — Telephone Encounter (Signed)
Will evaluate in office

## 2023-11-11 NOTE — Telephone Encounter (Signed)
Chief Complaint: chest pain Symptoms: intermittent chest pain Frequency: 1 week, two occurrences.  Pertinent Negatives: Patient denies SOB, cardiac hx, nausea, vomiting, sweating Disposition: [] ED /[] Urgent Care (no appt availability in office) / [] Appointment(In office/virtual)/ []  [facility]/ [] Home Care/ [x] Refused Recommended Disposition /[] [facility]/ []  Follow-up with PCP Additional Notes: Pt calls stating he has had two episodes of nonradiating chest pain in the last week. He reports the first episode was approx 1 week ago and lasted 1-2 hours, was relieved with pain medication. The second episode was this morning while sleeping, and lasted less than 5 minutes, resolved without medication. Reports the pain during epoisode as "sharp poking feeling". Pt denies pain at this time. States he has been on vacation this week and returning home this evening. States the CP began prior to traveling. Denies SOB, nausea, sweating, fever, cardiac hx. Per protocol, pt to be evaluated in ED now. Pt declines dispo, requests next available appt with any provider. Scheduled pt per request for 11/14/23 @ 0800- care advice reviewed, pt verbalized understanding but still declined ED for eval. Advised pt to be aware of worsening s/s. Alerting PCP for review.   Copied from CRM [id number]. Topic: Clinical - Red Word Triage >> Nov 11, 2023  2:44 PM Leavy Cella D wrote: Red Word that prompted transfer to Nurse Triage: Chest pain Reason for Disposition  [1] Chest pain (or "angina") comes and goes AND [2] is happening more often (increasing in frequency) or getting worse (increasing in severity)  (Exception: Chest pains that last only a few seconds.)  Answer Assessment - Initial Assessment Questions 1. LOCATION: "Where does it hurt?"       L side of chest 2. RADIATION: "Does the pain go anywhere else?" (e.g., into neck, jaw, arms, back)     Nonradiating in one spot, "sharp poking feeling" 3. ONSET:  "When did the chest pain begin?" (Minutes, hours or days)      Approx 1 week, comes and goes. Has had two episodes of CP 4. PATTERN: "Does the pain come and go, or has it been constant since it started?"  "Does it get worse with exertion?"      Intermittent, first episode approx 1 week ago, lasted 1-2 hours, takes something for pain and it goes away. 2nd episode was this morning at 0430, lasted less than 5 minutes, resolved 5. DURATION: "How long does it last" (e.g., seconds, minutes, hours)     1-2 hours 6. SEVERITY: "How bad is the pain?"  (e.g., Scale 1-10; mild, moderate, or severe)    - MILD (1-3): doesn't interfere with normal activities     - MODERATE (4-7): interferes with normal activities or awakens from sleep    - SEVERE (8-10): excruciating pain, unable to do any normal activities       Denies pain at this time, during event 6/10. 7. CARDIAC RISK FACTORS: "Do you have any history of heart problems or risk factors for heart disease?" (e.g., angina, prior heart attack; diabetes, high blood pressure, high cholesterol, smoker, or strong family history of heart disease)     denies 8. PULMONARY RISK FACTORS: "Do you have any history of lung disease?"  (e.g., blood clots in lung, asthma, emphysema, birth control pills)     denies 9. CAUSE: "What do you think is causing the chest pain?"     Unsure 10. OTHER SYMPTOMS: "Do you have any other symptoms?" (e.g., dizziness, nausea, vomiting, sweating, fever, difficulty breathing, cough)  denies  Protocols used: Chest Pain-A-AH

## 2023-11-11 NOTE — Telephone Encounter (Signed)
Agree with recommendation to go to ER, however pt appeared to have declined x 2. Thank you Matt for seeing patient.

## 2023-11-11 NOTE — Telephone Encounter (Signed)
Pt notified as instructed and pt voiced understanding. Pt said he was not having a problem right now but if his condition changed or worsened he would go to ED for evaluation; pt wants to keep appt with Audria Nine NP on 08/13/24 at 8 am at this time. Sending note to T Dugal FNP and Iven Finn NP.

## 2023-11-14 ENCOUNTER — Encounter: Payer: Self-pay | Admitting: Nurse Practitioner

## 2023-11-14 ENCOUNTER — Telehealth: Payer: Self-pay

## 2023-11-14 ENCOUNTER — Encounter: Payer: Self-pay | Admitting: Family

## 2023-11-14 ENCOUNTER — Ambulatory Visit: Payer: 59 | Admitting: Nurse Practitioner

## 2023-11-14 ENCOUNTER — Other Ambulatory Visit: Payer: Self-pay | Admitting: Family

## 2023-11-14 VITALS — BP 120/82 | HR 58 | Temp 97.7°F | Ht 75.0 in | Wt 232.6 lb

## 2023-11-14 DIAGNOSIS — F419 Anxiety disorder, unspecified: Secondary | ICD-10-CM | POA: Insufficient documentation

## 2023-11-14 DIAGNOSIS — R0789 Other chest pain: Secondary | ICD-10-CM | POA: Insufficient documentation

## 2023-11-14 DIAGNOSIS — G4733 Obstructive sleep apnea (adult) (pediatric): Secondary | ICD-10-CM

## 2023-11-14 LAB — COMPREHENSIVE METABOLIC PANEL
ALT: 17 U/L (ref 0–53)
AST: 15 U/L (ref 0–37)
Albumin: 4.4 g/dL (ref 3.5–5.2)
Alkaline Phosphatase: 64 U/L (ref 39–117)
BUN: 10 mg/dL (ref 6–23)
CO2: 27 meq/L (ref 19–32)
Calcium: 9.6 mg/dL (ref 8.4–10.5)
Chloride: 104 meq/L (ref 96–112)
Creatinine, Ser: 0.91 mg/dL (ref 0.40–1.50)
GFR: 105.01 mL/min (ref >60.00)
Glucose, Bld: 98 mg/dL (ref 70–99)
Potassium: 4.1 meq/L (ref 3.5–5.1)
Sodium: 140 meq/L (ref 135–145)
Total Bilirubin: 0.5 mg/dL (ref 0.2–1.2)
Total Protein: 7.7 g/dL (ref 6.0–8.3)

## 2023-11-14 LAB — CBC
HCT: 46.4 % (ref 39.0–52.0)
Hemoglobin: 15.3 g/dL (ref 13.0–17.0)
MCHC: 32.9 g/dL (ref 30.0–36.0)
MCV: 87.1 fL (ref 78.0–100.0)
Platelets: 313 10*3/uL (ref 150.0–400.0)
RBC: 5.33 Mil/uL (ref 4.22–5.81)
RDW: 13.5 % (ref 11.5–15.5)
WBC: 5.6 10*3/uL (ref 4.0–10.5)

## 2023-11-14 LAB — TSH: TSH: 1.22 u[IU]/mL (ref 0.35–5.50)

## 2023-11-14 MED ORDER — BUSPIRONE HCL 5 MG PO TABS
5.0000 mg | ORAL_TABLET | Freq: Two times a day (BID) | ORAL | 2 refills | Status: AC
Start: 2023-11-14 — End: ?

## 2023-11-14 NOTE — Assessment & Plan Note (Signed)
 EKG within normal limits.  Does not sound cardiac in nature.  Pending basic labs.  Basic labs are benign consider buspirone 5 mg twice daily with follow-up with primary care thereafter.

## 2023-11-14 NOTE — Telephone Encounter (Signed)
 Copied from CRM [id number]. Topic: Clinical - Medication Question >> Nov 14, 2023  4:39 PM Corin V wrote: Reason for CRM: Patient called [facility] Rx for anxiety that he was told would be sent to pharmacy. Agent advised that note indicates the provider wanted to review labs to ensure they are normal before sending an Rx in. Patietn was understanding. He asked that Rx be sent to: [facility]  [address] [place], [place]

## 2023-11-14 NOTE — Assessment & Plan Note (Signed)
 History of same.  Patient admits to nonadherence to CPAP therapy.

## 2023-11-14 NOTE — Patient Instructions (Signed)
 Nice to see you today EKG looked good in office I will be in touch with the labs once I have them Follow up with Tabitha as scheduled

## 2023-11-14 NOTE — Progress Notes (Signed)
 Acute Office Visit  Subjective:     Patient ID: Aaron Williamson, male    DOB: 05-Feb-1981, 43 y.o.   MRN: [id number]  Chief Complaint  Patient presents with   chest discomfort    Pt complains of sharp pains that last only seconds, states its close to the heart. States the pain comes and goes. Hasn't felt any pains since Monday. Started a couple weeks ago.     HPI Patient is in today for chest pain with a history of asthma, allergies, OSA, testicular abscess.  Syptoms started approx 2 weeks ago states that when it started it was left sdied and was given an ASA. States that the last time he was drivign back from vacatoin and it was  a few seconds.  Sharp stabbing.   States that he does have OSA and doe snot wear the mask all the time. States that he has not had any recent illness.  States that his anxiety has been high for the past couple months. States that he is having trouble sleeping. Can go to sleep but can not stay asleep. States that it is an everyday thing   No history of blood clots Recent travel back from [place] but happen prior to travel and then on the way back home     11/14/2023    8:15 AM 06/26/2023   11:09 AM 03/15/2023    9:10 AM 12/14/2022    9:26 AM  GAD 7 : Generalized Anxiety Score  Nervous, Anxious, on Edge 2 0 0 0  Control/stop worrying 0 0 0 0  Worry too much - different things 1 0 0 0  Trouble relaxing 0 0 0 0  Restless 0 0 0 0  Easily annoyed or irritable 2 0 0 0  Afraid - awful might happen 0 0 0 0  Total GAD 7 Score 5 0 0 0  Anxiety Difficulty Somewhat difficult Not difficult at all Not difficult at all Not difficult at all        11/14/2023    8:15 AM 06/26/2023   11:08 AM 03/15/2023    9:09 AM  PHQ9 SCORE ONLY  PHQ-9 Total Score 4 2 0     Review of Systems  Constitutional:  Negative for chills and fever.  Respiratory:  Negative for cough and shortness of breath.   Cardiovascular:  Positive for chest pain.  Gastrointestinal:  Negative for  abdominal pain, diarrhea, nausea and vomiting.  Neurological:  Negative for dizziness and headaches.  Psychiatric/Behavioral:  The patient has insomnia.         Objective:    BP 120/82   Pulse (!) 58   Temp 97.7 F (36.5 C) (Oral)   Ht 6' 3 (1.905 m)   Wt 232 lb 9.6 oz (105.5 kg)   SpO2 98%   BMI 29.07 kg/m    Physical Exam Vitals and nursing note reviewed.  Constitutional:      Appearance: Normal appearance.  Cardiovascular:     Rate and Rhythm: Normal rate and regular rhythm.     Heart sounds: Normal heart sounds.  Pulmonary:     Effort: Pulmonary effort is normal.     Breath sounds: Normal breath sounds.  Chest:     Chest wall: No tenderness.  Abdominal:     General: Bowel sounds are normal. There is no distension.     Palpations: There is no mass.     Tenderness: There is no abdominal tenderness.     Hernia:  No hernia is present.  Neurological:     Mental Status: He is alert.     No results found for any visits on 11/14/23.      Assessment & Plan:   Problem List Items Addressed This Visit       Respiratory   OSA (obstructive sleep apnea)   History of same.  Patient admits to nonadherence to CPAP therapy.        Other   Chest pain, atypical - Primary   EKG within normal limits.  Does not sound cardiac in nature.  Pending basic labs.  Basic labs are benign consider buspirone  5 mg twice daily with follow-up with primary care thereafter.      Relevant Orders   EKG 12-Lead (Completed)   CBC   Comprehensive metabolic panel   TSH   Anxiety   Some anxiety over the past months patient states it is daily.  If lab work comes back benign consider doing BuSpar  5 mg twice daily with follow-up with primary care thereafter      Relevant Orders   TSH    No orders of the defined types were placed in this encounter.   Return if symptoms worsen or fail to improve, for as scheduled.  Adina Crandall, NP

## 2023-11-14 NOTE — Assessment & Plan Note (Signed)
 Some anxiety over the past months patient states it is daily.  If lab work comes back benign consider doing BuSpar 5 mg twice daily with follow-up with primary care thereafter

## 2023-11-19 ENCOUNTER — Encounter: Payer: Self-pay | Admitting: Nurse Practitioner

## 2023-11-19 DIAGNOSIS — F419 Anxiety disorder, unspecified: Secondary | ICD-10-CM

## 2023-12-12 ENCOUNTER — Encounter: Payer: Self-pay | Admitting: Family

## 2023-12-12 ENCOUNTER — Other Ambulatory Visit (HOSPITAL_COMMUNITY): Payer: Self-pay

## 2023-12-12 ENCOUNTER — Ambulatory Visit: Payer: 59 | Admitting: Family

## 2023-12-12 VITALS — BP 134/84 | HR 78 | Temp 98.1°F | Ht 75.0 in | Wt 236.0 lb

## 2023-12-12 DIAGNOSIS — J029 Acute pharyngitis, unspecified: Secondary | ICD-10-CM

## 2023-12-12 DIAGNOSIS — Z20828 Contact with and (suspected) exposure to other viral communicable diseases: Secondary | ICD-10-CM

## 2023-12-12 DIAGNOSIS — Z20822 Contact with and (suspected) exposure to covid-19: Secondary | ICD-10-CM | POA: Diagnosis not present

## 2023-12-12 DIAGNOSIS — J020 Streptococcal pharyngitis: Secondary | ICD-10-CM | POA: Diagnosis not present

## 2023-12-12 LAB — POCT INFLUENZA A/B
Influenza A, POC: NEGATIVE
Influenza B, POC: NEGATIVE

## 2023-12-12 LAB — POC COVID19 BINAXNOW: SARS Coronavirus 2 Ag: NEGATIVE

## 2023-12-12 LAB — POCT RAPID STREP A (OFFICE): Rapid Strep A Screen: POSITIVE — AB

## 2023-12-12 MED ORDER — AMOXICILLIN 875 MG PO TABS
875.0000 mg | ORAL_TABLET | Freq: Two times a day (BID) | ORAL | 0 refills | Status: AC
Start: 2023-12-12 — End: 2023-12-22
  Filled 2023-12-12: qty 20, 10d supply, fill #0

## 2023-12-12 NOTE — Assessment & Plan Note (Signed)
Strep tested positive in office.  rx for amox 875 mg po bid x 10 days.  Ibuprofen/tyelnol prn sore throat/fever Pt told to F/u if no improvement in the next 2-3 days.

## 2023-12-12 NOTE — Patient Instructions (Signed)
Strep tested positive in office.  Ibuprofen/tyelnol prn sore throat/fever Pt told to F/u if no improvement in the next 2-3 days.

## 2023-12-12 NOTE — Progress Notes (Signed)
Established Patient Office Visit  Subjective:   Patient ID: Aaron Williamson, male    DOB: January 23, 1981  Age: 43 y.o. MRN: [id number]  CC:  Chief Complaint  Patient presents with   Acute Visit    Reports body aches, fever, sore throat, coughing.    HPI: Aaron Williamson is a 43 y.o. male presenting on 12/12/2023 for Acute Visit (Reports body aches, fever, sore throat, coughing.)  Two days ago started with body aches, sore throat. He is now coughing, dry cough.   Trying home remedies with cough drops and theraflu. Still with chills.        ROS: Negative unless specifically indicated above in HPI.   Relevant past medical history reviewed and updated as indicated.   Allergies and medications reviewed and updated.   Current Outpatient Medications:    amoxicillin (AMOXIL) 875 MG tablet, Take 1 tablet (875 mg total) by mouth 2 (two) times daily for 10 days., Disp: 20 tablet, Rfl: 0   busPIRone (BUSPAR) 5 MG tablet, Take 1 tablet (5 mg total) by mouth 2 (two) times daily., Disp: 60 tablet, Rfl: 2   ibuprofen (ADVIL) 600 MG tablet, Take 1 tablet (600 mg total) by mouth every 8 (eight) hours as needed., Disp: 30 tablet, Rfl: 0  Allergies  Allergen Reactions   Fish Allergy Swelling    Objective:   BP 134/84 (BP Location: Left Arm, Patient Position: Sitting, Cuff Size: Normal)   Pulse 78   Temp 98.1 F (36.7 C) (Oral)   Ht 6\' 3"  (1.905 m)   Wt 236 lb (107 kg)   SpO2 98%   BMI 29.50 kg/m    Physical Exam Vitals reviewed.  Constitutional:      General: He is not in acute distress.    Appearance: Normal appearance. He is not ill-appearing, toxic-appearing or diaphoretic.  HENT:     Head: Normocephalic.     Right Ear: Tympanic membrane normal.     Left Ear: Tympanic membrane normal.     Nose: Nose normal.     Mouth/Throat:     Mouth: Mucous membranes are moist.     Pharynx: Posterior oropharyngeal erythema present.     Tonsils: No tonsillar exudate. 1+ on the  right. 1+ on the left.  Eyes:     Pupils: Pupils are equal, round, and reactive to light.  Cardiovascular:     Rate and Rhythm: Normal rate and regular rhythm.  Pulmonary:     Effort: Pulmonary effort is normal.     Breath sounds: Normal breath sounds. No wheezing.  Musculoskeletal:        General: Normal range of motion.     Cervical back: Normal range of motion.  Lymphadenopathy:     Cervical: Cervical adenopathy present.     Right cervical: Superficial cervical adenopathy present.  Neurological:     General: No focal deficit present.     Mental Status: He is alert and oriented to person, place, and time. Mental status is at baseline.  Psychiatric:        Mood and Affect: Mood normal.        Behavior: Behavior normal.        Thought Content: Thought content normal.        Judgment: Judgment normal.     Assessment & Plan:  Suspected COVID-19 virus infection -     POC COVID-19 BinaxNow  Sore throat -     POCT rapid strep A  Exposure to influenza -  POCT Influenza A/B  Strep pharyngitis Assessment & Plan: Strep tested positive in office.  rx for amox 875 mg po bid x 10 days.  Ibuprofen/tyelnol prn sore throat/fever Pt told to F/u if no improvement in the next 2-3 days.   Orders: -     Amoxicillin; Take 1 tablet (875 mg total) by mouth 2 (two) times daily for 10 days.  Dispense: 20 tablet; Refill: 0     Follow up plan: Return if symptoms worsen or fail to improve.  Mort Sawyers, FNP

## 2023-12-16 ENCOUNTER — Encounter: Payer: 59 | Admitting: Family

## 2023-12-16 ENCOUNTER — Encounter: Payer: Self-pay | Admitting: Family

## 2023-12-16 ENCOUNTER — Other Ambulatory Visit (HOSPITAL_COMMUNITY): Payer: Self-pay

## 2023-12-16 DIAGNOSIS — R051 Acute cough: Secondary | ICD-10-CM

## 2023-12-16 MED ORDER — PREDNISONE 10 MG (21) PO TBPK
ORAL_TABLET | ORAL | 0 refills | Status: DC
Start: 2023-12-16 — End: 2024-03-09
  Filled 2023-12-16: qty 21, 6d supply, fill #0

## 2023-12-16 MED ORDER — GUAIFENESIN-CODEINE 100-10 MG/5ML PO SOLN
5.0000 mL | Freq: Three times a day (TID) | ORAL | 0 refills | Status: AC | PRN
Start: 2023-12-16 — End: 2023-12-21
  Filled 2023-12-16: qty 75, 5d supply, fill #0

## 2023-12-16 NOTE — Addendum Note (Signed)
Addended by: Mort Sawyers on: 12/16/2023 02:10 PM   Modules accepted: Orders

## 2024-01-03 ENCOUNTER — Ambulatory Visit (HOSPITAL_COMMUNITY): Payer: Self-pay

## 2024-01-29 ENCOUNTER — Other Ambulatory Visit (HOSPITAL_COMMUNITY): Payer: Self-pay

## 2024-01-29 MED ORDER — POLYMYXIN B-TRIMETHOPRIM 10000-0.1 UNIT/ML-% OP SOLN
1.0000 [drp] | OPHTHALMIC | 0 refills | Status: DC
Start: 2024-01-29 — End: 2024-03-20
  Filled 2024-01-29: qty 10, 31d supply, fill #0

## 2024-01-31 ENCOUNTER — Encounter: Payer: 59 | Admitting: Family

## 2024-02-14 ENCOUNTER — Encounter: Payer: Self-pay | Admitting: Family

## 2024-02-17 NOTE — Telephone Encounter (Signed)
   Please see the MyChart message reply(ies) for my assessment and plan.  The patient gave consent for this Medical Advice Message and is aware that it may result in a bill to their insurance company as well as the possibility that this may result in a co-payment or deductible. They are an established patient, but are not seeking medical advice exclusively about a problem treated during an in person or video visit in the last 7 days. I did not recommend an in person or video visit within 7 days of my reply.  I spent a total of 3 minutes cumulative time within 7 days through MyChart messaging Azalyn Sliwa, FNP  

## 2024-02-27 ENCOUNTER — Encounter (HOSPITAL_COMMUNITY): Payer: Self-pay

## 2024-02-27 ENCOUNTER — Emergency Department (HOSPITAL_COMMUNITY)
Admission: EM | Admit: 2024-02-27 | Discharge: 2024-02-28 | Disposition: A | Discharge status: Home / Self Care | Attending: Emergency Medicine | Admitting: Emergency Medicine

## 2024-02-27 ENCOUNTER — Emergency Department (HOSPITAL_COMMUNITY)

## 2024-02-27 ENCOUNTER — Other Ambulatory Visit: Payer: Self-pay

## 2024-02-27 DIAGNOSIS — Y92480 Sidewalk as the place of occurrence of the external cause: Secondary | ICD-10-CM | POA: Insufficient documentation

## 2024-02-27 DIAGNOSIS — R42 Dizziness and giddiness: Secondary | ICD-10-CM | POA: Diagnosis present

## 2024-02-27 DIAGNOSIS — W101XXA Fall (on)(from) sidewalk curb, initial encounter: Secondary | ICD-10-CM | POA: Insufficient documentation

## 2024-02-27 DIAGNOSIS — S00211A Abrasion of right eyelid and periocular area, initial encounter: Secondary | ICD-10-CM | POA: Diagnosis not present

## 2024-02-27 DIAGNOSIS — W19XXXA Unspecified fall, initial encounter: Secondary | ICD-10-CM

## 2024-02-27 DIAGNOSIS — S0990XA Unspecified injury of head, initial encounter: Secondary | ICD-10-CM | POA: Insufficient documentation

## 2024-02-27 NOTE — ED Triage Notes (Signed)
 Pt reports tripping and falling about 30 minutes ago and hit head on concrete. Hematoma noted to right eyebrow with abrasions. Pt awake and alert, A&Ox4. (-) thinners, (-) LOC. Mild blurred vision reported, mild dizziness at this time. Normal gait. Denies N/V. Pt reports pain to right pinky finger after fall. No deformity or swelling noted.

## 2024-02-28 MED ORDER — KETOROLAC TROMETHAMINE 15 MG/ML IJ SOLN
15.0000 mg | Freq: Once | INTRAMUSCULAR | Status: AC
  Administered 2024-02-28: 15 mg via INTRAMUSCULAR
  Filled 2024-02-28: qty 1

## 2024-02-28 NOTE — ED Provider Notes (Signed)
 [place] EMERGENCY DEPARTMENT AT [facility] Provider Note   CSN: [id number] Arrival date & time: 02/27/24  2212     History  Chief Complaint  Patient presents with   Fall   HPI Aaron Williamson is a 43 y.o. male patient presenting for a fall.  Occurred about 45 minutes ago.  States he tripped and fell on the sidewalk and hit his head from a standing position.  Now with a hematoma and abrasion to the right eyebrow.  Also had some blurry vision as well and some dizziness just after the fall.  Also endorses right little finger pain but still able to move the finger and denies loss of sensation.  Denies recent alcohol use.  Denies chest pain or palpitations or syncope.  Denies neck pain.   Fall       Home Medications Prior to Admission medications   Medication Sig Start Date End Date Taking? Authorizing Provider  busPIRone  (BUSPAR ) 5 MG tablet Take 1 tablet (5 mg total) by mouth 2 (two) times daily. 11/14/23   Dugal, Tabitha, FNP  ibuprofen  (ADVIL ) 600 MG tablet Take 1 tablet (600 mg total) by mouth every 8 (eight) hours as needed. 12/14/22   Corwin Antu, FNP  predniSONE  (STERAPRED UNI-PAK 21 TAB) 10 MG (21) TBPK tablet Take as directed on package. 12/16/23   Dugal, Tabitha, FNP  trimethoprim -polymyxin b  (POLYTRIM ) ophthalmic solution Place 1 drop into both eyes every 3 (three) hours while awake for 7-10 days. 01/29/24   Trudy Wanda MATSU, FNP      Allergies    Fish allergy    Review of Systems   See HPI   Physical Exam   Vitals:   02/27/24 2223 02/27/24 2224  BP: (!) 161/109 (!) 161/109  Pulse:  72  Resp:  18  Temp:    SpO2:  95%    CONSTITUTIONAL:  well-appearing, NAD NEURO:  GCS 15. Speech is goal oriented. No deficits appreciated to CN III-XII; symmetric eyebrow raise, no facial drooping, tongue midline. Patient has equal grip strength bilaterally with 5/5 strength against resistance in all major muscle groups bilaterally. Sensation to light touch  intact. Patient moves extremities without ataxia. Normal finger-nose-finger. Patient ambulatory with steady gait. Head: Small abrasion and edema above the right eyebrow.  No raccoon eyes, Battle sign or rhinorrhea. EYES:  eyes equal and reactive ENT/NECK:  Supple, no stridor  CARDIO:  regular rate and rhythm, appears well-perfused  PULM:  No respiratory distress, CTAB GI/GU:  non-distended, soft MSK/SPINE:  No gross deformities, no edema, moves all extremities  SKIN:  no rash, atraumatic   *Additional and/or pertinent findings included in MDM below    ED Results / Procedures / Treatments   Labs (all labs ordered are listed, but only abnormal results are displayed) Labs Reviewed - No data to display  EKG None  Radiology CT Head Wo Contrast Result Date: 02/27/2024 CLINICAL DATA:  Facial trauma. EXAM: CT HEAD WITHOUT CONTRAST TECHNIQUE: Contiguous axial images were obtained from the base of the skull through the vertex without intravenous contrast. RADIATION DOSE REDUCTION: This exam was performed according to the departmental dose-optimization program which includes automated exposure control, adjustment of the mA and/or kV according to patient size and/or use of iterative reconstruction technique. COMPARISON:  None Available. FINDINGS: Brain: No evidence of acute infarction, hemorrhage, hydrocephalus, extra-axial collection or mass lesion/mass effect. Vascular: No hyperdense vessel or unexpected calcification. Skull: Normal. Negative for fracture or focal lesion. Sinuses/Orbits: No acute finding. Other:  There is soft tissue swelling in the right supraorbital region. IMPRESSION: 1. No acute intracranial process. 2. Soft tissue swelling in the right supraorbital region. Electronically Signed   By: Greig Pique M.D.   On: 02/27/2024 23:13   DG Finger Little Right Result Date: 02/27/2024 CLINICAL DATA:  Injury EXAM: RIGHT LITTLE FINGER 2+V COMPARISON:  Right hand x-ray 03/18/2016. FINDINGS:  There is no evidence of fracture or dislocation. There is no evidence of arthropathy or other focal bone abnormality. Soft tissues are unremarkable. IMPRESSION: Negative. Electronically Signed   By: Greig Pique M.D.   On: 02/27/2024 22:54    Procedures Procedures    Medications Ordered in ED Medications  ketorolac  (TORADOL ) 15 MG/ML injection 15 mg (15 mg Intramuscular Given 02/28/24 0046)    ED Course/ Medical Decision Making/ A&P                                 Medical Decision Making Amount and/or Complexity of Data Reviewed Radiology: ordered.   43 year old well-appearing male presenting for fall.  Exam notable for right eyebrow swelling and abrasion but otherwise reassuring.  CT head was negative for acute injury.  X-ray of the right little finger also negative for acute injury.  Clean the wound to the right eyebrow and applied a nonadherent dressing.  Patient able to ambulate up and down the hall with no issue.  Treated his pain with Toradol .  Advised him to follow-up with his PCP.  Discussed return precautions.  Advised supportive treatment at home as he may have sustained a mild concussion.  Discharged good condition.        Final Clinical Impression(s) / ED Diagnoses Final diagnoses:  Fall, initial encounter  Injury of head, initial encounter    Rx / DC Orders ED Discharge Orders     None         Lang Norleen POUR, PA-C 02/28/24 0048    Trine Raynell Moder, MD 02/28/24 1807

## 2024-02-28 NOTE — Discharge Instructions (Signed)
 Evaluation today was overall reassuring.  It is possible you may have sustained a mild concussion.  Please treat conservatively at home.  Please follow-up your PCP.  If you have new nausea vomiting, visual disturbance, numbness or weakness in your extremities, facial droop or any other concerning symptom please return to the ED for further evaluation.

## 2024-02-28 NOTE — ED Notes (Signed)
 Abrasion above eyebrow cleansed with iodine and dressing applied.

## 2024-03-05 ENCOUNTER — Ambulatory Visit: Admitting: Family

## 2024-03-09 ENCOUNTER — Encounter: Payer: Self-pay | Admitting: Family

## 2024-03-09 ENCOUNTER — Other Ambulatory Visit (HOSPITAL_COMMUNITY): Payer: Self-pay

## 2024-03-09 ENCOUNTER — Ambulatory Visit: Admitting: Family

## 2024-03-09 ENCOUNTER — Telehealth: Payer: Self-pay

## 2024-03-09 VITALS — BP 146/90 | HR 98 | Temp 98.4°F | Ht 75.0 in | Wt 243.6 lb

## 2024-03-09 DIAGNOSIS — G44309 Post-traumatic headache, unspecified, not intractable: Secondary | ICD-10-CM | POA: Insufficient documentation

## 2024-03-09 DIAGNOSIS — J301 Allergic rhinitis due to pollen: Secondary | ICD-10-CM

## 2024-03-09 DIAGNOSIS — J0110 Acute frontal sinusitis, unspecified: Secondary | ICD-10-CM | POA: Insufficient documentation

## 2024-03-09 DIAGNOSIS — R030 Elevated blood-pressure reading, without diagnosis of hypertension: Secondary | ICD-10-CM | POA: Diagnosis not present

## 2024-03-09 MED ORDER — AMOXICILLIN-POT CLAVULANATE 875-125 MG PO TABS
1.0000 | ORAL_TABLET | Freq: Two times a day (BID) | ORAL | 0 refills | Status: DC
Start: 2024-03-09 — End: 2024-03-20
  Filled 2024-03-09: qty 20, 10d supply, fill #0

## 2024-03-09 MED ORDER — CETIRIZINE HCL 10 MG PO TABS
10.0000 mg | ORAL_TABLET | Freq: Every day | ORAL | 11 refills | Status: AC
Start: 2024-03-09 — End: ?
  Filled 2024-03-09: qty 30, 30d supply, fill #0

## 2024-03-09 MED ORDER — FLUTICASONE PROPIONATE 50 MCG/ACT NA SUSP
2.0000 | Freq: Every day | NASAL | 6 refills | Status: AC
Start: 2024-03-09 — End: ?
  Filled 2024-03-09: qty 16, 30d supply, fill #0

## 2024-03-09 NOTE — Telephone Encounter (Addendum)
 FMLA for Patient forms received for completion for patient. Patient has been informed that process may take up to 5 business days.  Employer Name [employer] Reason for being out: Fall leading to intense headaches. Any inpatient care: N/A Any Planned appointment? 5.5.25 Dr. Geralyn Knee 5.9.25 [place] Patient is requesting start date of 4.28.25 Patient is requesting end date of 10.28.25   Patient is requesting duration of 1/2 day with 1 episodes every week.   Verified with patient that it is ok to leave Voicemail updates on  Mobile [phone] (mobile)  Patient would like to pick up copy in our office when form is completed.   Patient informed he will need to pick up forms to take to employer.   Paperwork put in providers box for review.

## 2024-03-09 NOTE — Assessment & Plan Note (Signed)
 Prescription given for augmentin 875/125 mg po bid for ten days. Pt to continue tylenol/ibuprofen prn sinus pain. Continue with humidifier prn and steam showers recommended as well. instructed If no symptom improvement in 48 hours please f/u

## 2024-03-09 NOTE — Progress Notes (Addendum)
 Established Patient Office Visit  Subjective:   Patient ID: Aaron Williamson, male    DOB: 1981-11-09  Age: 43 y.o. MRN: [id number]  CC:  Chief Complaint  Patient presents with   Headache    HPI: Aaron Williamson is a 43 y.o. male presenting on 03/09/2024 for Headache  Went to ER 4/17 for a fall where he tripped and fell on a sidewalk and hit his head resulting in hematoma and abrasion to right eyebrow. Had c/o blurry vision and dizziness. He had alost fell on his left pinky finger, xray left pinky finger unremarkable.  CT head wo contrast negative for acute findings, no sign of hemorrhage he states upon arrival he was told he was lethargic and out of it.   Elevated BP in ER, 161/109  He states since the fall he has pretty bad headaches intermittent, come and go. They can be pretty intense, he feels them on the entire top of his scalp, constant pounding and then will stop. Will last an hour or at most two hours. His last headache was yesterday, one prior was three days prior. He has had healing of the bruise, this is improved. The symptoms worsen with bending forward. No blurry vision associated with headaches, he does report some sinus issues with the pollen and sneezing often.no increased fatigue, no slurring of words. No change in gait.   He also states unrelated he itches often, all over, and daily. He will see hives as well. Benadryl has been helpful otherwise he takes otc claritin.   He thinks that he might need some FMLA as he has been intermittently out of work since the fall. 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 This has been difficult with the headaches.        ROS: Negative unless specifically indicated above in HPI.   Relevant past medical history reviewed and updated as indicated.   Allergies and medications reviewed and updated.   Current Outpatient  Medications:    amoxicillin -clavulanate (AUGMENTIN) 875-125 MG tablet, Take 1 tablet by mouth 2 (two) times daily., Disp: 20 tablet, Rfl: 0   busPIRone  (BUSPAR ) 5 MG tablet, Take 1 tablet (5 mg total) by mouth 2 (two) times daily., Disp: 60 tablet, Rfl: 2   cetirizine (ZYRTEC) 10 MG tablet, Take 1 tablet (10 mg total) by mouth daily., Disp: 30 tablet, Rfl: 11   fluticasone  (FLONASE ) 50 MCG/ACT nasal spray, Place 2 sprays into both nostrils daily., Disp: 16 g, Rfl: 6   ibuprofen  (ADVIL ) 600 MG tablet, Take 1 tablet (600 mg total) by mouth every 8 (eight) hours as needed., Disp: 30 tablet, Rfl: 0   trimethoprim -polymyxin b  (POLYTRIM ) ophthalmic solution, Place 1 drop into both eyes every 3 (three) hours while awake for 7-10 days., Disp: 10 mL, Rfl: 0  Allergies  Allergen Reactions   Fish Allergy Swelling    Objective:   BP (!) 146/90 (BP Location: Left Arm, Patient Position: Sitting, Cuff Size: Normal)   Pulse 98   Temp 98.4 F (36.9 C) (Temporal)   Ht 6\' 3"  (1.905 m)   Wt 243 lb 9.6 oz (110.5 kg)   SpO2 98%   BMI 30.45 kg/m    Physical Exam Vitals reviewed.  Constitutional:      General: He is not in acute distress.    Appearance: Normal appearance. He is obese. He is not ill-appearing, toxic-appearing  or diaphoretic.  HENT:     Head: Normocephalic.     Right Ear: Tympanic membrane normal.     Left Ear: Tympanic membrane normal.     Nose: Nose normal.     Right Turbinates: Enlarged and swollen.     Right Sinus: No maxillary sinus tenderness or frontal sinus tenderness.     Left Sinus: No maxillary sinus tenderness or frontal sinus tenderness.     Mouth/Throat:     Mouth: Mucous membranes are moist.  Eyes:     Pupils: Pupils are equal, round, and reactive to light.  Cardiovascular:     Rate and Rhythm: Normal rate and regular rhythm.  Pulmonary:     Effort: Pulmonary effort is normal.     Breath sounds: Normal breath sounds. No wheezing.  Musculoskeletal:        General:  Normal range of motion.     Cervical back: Normal range of motion.  Neurological:     General: No focal deficit present.     Mental Status: He is alert and oriented to person, place, and time. Mental status is at baseline.  Psychiatric:        Mood and Affect: Mood normal.        Behavior: Behavior normal.        Thought Content: Thought content normal.        Judgment: Judgment normal.     Assessment & Plan:  Post-concussion headache Assessment & Plan: Consult with Dr Geralyn Knee ER precautions given Will defer repeat CT scan.  Also for headaches, monitor blood pressure daily and report back in one week with averages.  Sinusitis also going to be treated.  Will initiate FMLA for 1/2 times a week up to 24 hours at a time intermittently from date 03/09/24 to 09/08/24    Elevated blood pressure reading in office without diagnosis of hypertension Assessment & Plan: Pt advised of the following:  Continue medication as prescribed. Monitor blood pressure periodically and/or when you feel symptomatic. Goal is <130/90 on average. Ensure that you have rested for 30 minutes prior to checking your blood pressure. Record your readings and bring them to your next visit if necessary.work on a low sodium diet.    Acute non-recurrent frontal sinusitis Assessment & Plan: Prescription given for augmentin 875/125 mg po bid for ten days. Pt to continue tylenol /ibuprofen  prn sinus pain. Continue with humidifier prn and steam showers recommended as well. instructed If no symptom improvement in 48 hours please f/u   Orders: -     Amoxicillin -Pot Clavulanate; Take 1 tablet by mouth 2 (two) times daily.  Dispense: 20 tablet; Refill: 0  Seasonal allergic rhinitis due to pollen Assessment & Plan: Start flonase  and zyrtec Likely will help with hives/pruritus as well  Orders: -     Fluticasone  Propionate; Place 2 sprays into both nostrils daily.  Dispense: 16 g; Refill: 6 -     Cetirizine HCl; Take 1  tablet (10 mg total) by mouth daily.  Dispense: 30 tablet; Refill: 11     Follow up plan: Return if symptoms worsen or fail to improve.  Felicita Horns, FNP

## 2024-03-09 NOTE — Assessment & Plan Note (Signed)
 Pt advised of the following:  Continue medication as prescribed. Monitor blood pressure periodically and/or when you feel symptomatic. Goal is <130/90 on average. Ensure that you have rested for 30 minutes prior to checking your blood pressure. Record your readings and bring them to your next visit if necessary.work on a low sodium diet.

## 2024-03-09 NOTE — Assessment & Plan Note (Signed)
 Start flonase  and zyrtec Likely will help with hives/pruritus as well

## 2024-03-09 NOTE — Patient Instructions (Addendum)
  Recommend daily flonase  and also zyrtec at night for allergies.   ------------------------------------    Start monitoring your blood pressure daily, around the same time of day, for the next 2-3 weeks.  Ensure that you have rested for 30 minutes prior to checking your blood pressure. Record your readings and bring them to your next visit.  ------------------------------------

## 2024-03-09 NOTE — Assessment & Plan Note (Addendum)
 Consult with Dr Geralyn Knee ER precautions given Will defer repeat CT scan.  Also for headaches, monitor blood pressure daily and report back in one week with averages.  Sinusitis also going to be treated.  Will initiate FMLA for 1/2 times a week up to 24 hours at a time intermittently from date 03/09/24 to 09/08/24

## 2024-03-11 NOTE — Telephone Encounter (Addendum)
 Completed FMLA forms received, 3 attempts to fax forms to patients Human Resource department at [phone] with no response. Copy of forms have been sent to scan. Pt will pick up his copy at the front desk at his next appointment at our office as well as the original form to take to his employer.

## 2024-03-11 NOTE — Telephone Encounter (Signed)
 Hey Erin thanks \\so  much for filling this out.  I did have to change a few things,  He is using intermittent leave starting 4/28 instead of 4/17 (his employer requested this start date) and this is intermittent vs continuous block of time. He is going to do 2 days a week up to 24 hours at a time. When you do the one week option it gets harry with their employment so I have found this is the best for coverage to make sure no complications.  If you have any general Q about FMLA feel free to ask me, it can be so confusing it has taken me several FMLAs to get even a better understanding.

## 2024-03-11 NOTE — Telephone Encounter (Signed)
 NOTED

## 2024-03-15 NOTE — Progress Notes (Deleted)
     Trindon Dorton T. Bhavya Eschete, MD, CAQ Sports Medicine [facility] at [facility] [address] [place], [address]  Phone: [phone]  FAX: [phone]  Aaron Williamson - 43 y.o. male  MRN [id number]  Date of Birth: [birth year]  Date: 03/16/2024  PCP: Felicita Horns, FNP  Referral: Felicita Horns, FNP  No chief complaint on file.  Subjective:   Aaron Williamson is a 43 y.o. very pleasant male patient with There is no height or weight on file to calculate BMI. who presents with the following:  The patient is here for concussion management.  He was seen in the ER on February 27, 2024 for fall and head injury.  He tripped on the sidewalk and hit his head from a standing position.  Did develop a hematoma and abrasion, and also complained initially of some blurry vision and dizziness.  No intracranial hemorrhage on acute head CT.  He had additional follow-up on March 09, 2024 with Mrs. Dugal at that point he was having some fairly severe headache and some issues with focusing.  He has been placed on intermittent FMLA.    Review of Systems is noted in the HPI, as appropriate  Objective:   There were no vitals taken for this visit.  GEN: No acute distress; alert,appropriate. PULM: Breathing comfortably in no respiratory distress PSYCH: Normally interactive.   Laboratory and Imaging Data:  Assessment and Plan:   ***

## 2024-03-16 ENCOUNTER — Ambulatory Visit: Admitting: Family Medicine

## 2024-03-16 DIAGNOSIS — S060X0D Concussion without loss of consciousness, subsequent encounter: Secondary | ICD-10-CM

## 2024-03-19 ENCOUNTER — Encounter (HOSPITAL_COMMUNITY): Payer: Self-pay

## 2024-03-20 ENCOUNTER — Encounter: Payer: Self-pay | Admitting: Family

## 2024-03-20 ENCOUNTER — Ambulatory Visit (INDEPENDENT_AMBULATORY_CARE_PROVIDER_SITE_OTHER): Payer: 59 | Admitting: Family

## 2024-03-20 ENCOUNTER — Other Ambulatory Visit (HOSPITAL_COMMUNITY): Payer: Self-pay

## 2024-03-20 VITALS — BP 156/104 | HR 100 | Temp 98.4°F | Ht 75.0 in | Wt 245.0 lb

## 2024-03-20 DIAGNOSIS — G4733 Obstructive sleep apnea (adult) (pediatric): Secondary | ICD-10-CM

## 2024-03-20 DIAGNOSIS — I10 Essential (primary) hypertension: Secondary | ICD-10-CM | POA: Diagnosis not present

## 2024-03-20 DIAGNOSIS — S6991XA Unspecified injury of right wrist, hand and finger(s), initial encounter: Secondary | ICD-10-CM

## 2024-03-20 DIAGNOSIS — Z0000 Encounter for general adult medical examination without abnormal findings: Secondary | ICD-10-CM

## 2024-03-20 DIAGNOSIS — E7800 Pure hypercholesterolemia, unspecified: Secondary | ICD-10-CM | POA: Diagnosis not present

## 2024-03-20 DIAGNOSIS — R7303 Prediabetes: Secondary | ICD-10-CM | POA: Insufficient documentation

## 2024-03-20 DIAGNOSIS — E538 Deficiency of other specified B group vitamins: Secondary | ICD-10-CM

## 2024-03-20 MED ORDER — AMLODIPINE BESYLATE 5 MG PO TABS
5.0000 mg | ORAL_TABLET | Freq: Every day | ORAL | 0 refills | Status: DC
Start: 2024-03-20 — End: 2024-04-20
  Filled 2024-03-20: qty 30, 30d supply, fill #0
  Filled 2024-04-15: qty 30, 30d supply, fill #1

## 2024-03-20 MED ORDER — MELOXICAM 7.5 MG PO TABS
7.5000 mg | ORAL_TABLET | Freq: Every day | ORAL | 0 refills | Status: AC
Start: 2024-03-20 — End: ?
  Filled 2024-03-20: qty 30, 30d supply, fill #0

## 2024-03-20 NOTE — Assessment & Plan Note (Signed)
 Ordered lipid panel, pending results. Work on low cholesterol diet and exercise as tolerated

## 2024-03-20 NOTE — Assessment & Plan Note (Signed)
 Not compliant with cpap  He uses it intermittently  Discussed compliance

## 2024-03-20 NOTE — Progress Notes (Signed)
 Subjective:  Patient ID: Aaron Williamson, male    DOB: 08-31-1981  Age: 43 y.o. MRN: [id number]  Patient Care Team: Felicita Horns, FNP as PCP - General (Family Medicine)   CC:  Chief Complaint  Patient presents with   Annual Exam    HPI Aaron Williamson is a 43 y.o. male who presents today for an annual physical exam. He reports consuming a general diet. The patient does not participate in regular exercise at present. He generally feels well. He reports sleeping well. He does have additional problems to discuss today.   Vision:Not within last year Dental:No regular dental care  Pt is with acute concerns.   Right hand lateral side, still swollen and tender. Good rom but when he makes a fist he feels pain. Ibuprofen  is helping when he takes it.   HTN: no cp palp or sob.     Advanced Directives Patient does not have advanced directives   DEPRESSION SCREENING    03/20/2024    9:22 AM 03/09/2024    8:55 AM 11/14/2023    8:15 AM 06/26/2023   11:08 AM 03/15/2023    9:09 AM 12/14/2022    9:25 AM 11/24/2021   12:52 PM  PHQ 2/9 Scores  PHQ - 2 Score 0 0 0 0 0 0 0  PHQ- 9 Score 0 0 4 2  0      ROS: Negative unless specifically indicated above in HPI.    Current Outpatient Medications:    amLODipine (NORVASC) 5 MG tablet, Take 1 tablet (5 mg total) by mouth daily., Disp: 90 tablet, Rfl: 0   busPIRone  (BUSPAR ) 5 MG tablet, Take 1 tablet (5 mg total) by mouth 2 (two) times daily., Disp: 60 tablet, Rfl: 2   cetirizine  (ZYRTEC ) 10 MG tablet, Take 1 tablet (10 mg total) by mouth daily., Disp: 30 tablet, Rfl: 11   fluticasone  (FLONASE ) 50 MCG/ACT nasal spray, Place 2 sprays into both nostrils daily., Disp: 16 g, Rfl: 6   ibuprofen  (ADVIL ) 600 MG tablet, Take 1 tablet (600 mg total) by mouth every 8 (eight) hours as needed., Disp: 30 tablet, Rfl: 0   meloxicam  (MOBIC ) 7.5 MG tablet, Take 1 tablet (7.5 mg total) by mouth daily., Disp: 30 tablet, Rfl: 0    Objective:    BP (!)  156/104 (BP Location: Left Arm, Patient Position: Sitting, Cuff Size: Large)   Pulse 100   Temp 98.4 F (36.9 C) (Temporal)   Ht 6\' 3"  (1.905 m)   Wt 245 lb (111.1 kg)   SpO2 99%   BMI 30.62 kg/m   BP Readings from Last 3 Encounters:  03/20/24 (!) 156/104  03/09/24 (!) 146/90  02/27/24 (!) 161/109      Physical Exam Vitals reviewed.  Constitutional:      General: He is not in acute distress.    Appearance: Normal appearance. He is obese. He is not ill-appearing, toxic-appearing or diaphoretic.  HENT:     Head: Normocephalic.     Right Ear: Tympanic membrane normal.     Left Ear: Tympanic membrane normal.     Nose: Nose normal.     Mouth/Throat:     Mouth: Mucous membranes are moist.  Eyes:     Pupils: Pupils are equal, round, and reactive to light.  Cardiovascular:     Rate and Rhythm: Normal rate and regular rhythm.  Pulmonary:     Effort: Pulmonary effort is normal.     Breath sounds: Normal breath sounds.  Abdominal:     General: Abdomen is flat.     Tenderness: There is no abdominal tenderness.  Musculoskeletal:        General: Normal range of motion.  Skin:    General: Skin is warm.  Neurological:     General: No focal deficit present.     Mental Status: He is alert and oriented to person, place, and time. Mental status is at baseline.  Psychiatric:        Mood and Affect: Mood normal.        Behavior: Behavior normal.        Thought Content: Thought content normal.        Judgment: Judgment normal.          Assessment & Plan:  Primary hypertension Assessment & Plan: Pt advised of the following:  Continue medication as prescribed. Monitor blood pressure periodically and/or when you feel symptomatic. Goal is <130/90 on average. Ensure that you have rested for 30 minutes prior to checking your blood pressure. Record your readings and bring them to your next visit if necessary.work on a low sodium diet. Start amlodipine 5 mg once daily   Orders: -      amLODIPine Besylate; Take 1 tablet (5 mg total) by mouth daily.  Dispense: 90 tablet; Refill: 0 -     Microalbumin / creatinine urine ratio; Future  Prediabetes -     Hemoglobin A1c; Future  Elevated LDL cholesterol level Assessment & Plan: Ordered lipid panel, pending results. Work on low cholesterol diet and exercise as tolerated   Orders: -     Lipid panel; Future  B12 deficiency -     Vitamin B12; Future  Hand injuries, right, initial encounter -     Meloxicam ; Take 1 tablet (7.5 mg total) by mouth daily.  Dispense: 30 tablet; Refill: 0  OSA (obstructive sleep apnea) Assessment & Plan: Not compliant with cpap  He uses it intermittently  Discussed compliance        Follow-up: Return in about 1 month (around 04/20/2024) for f/u blood pressure.   Felicita Horns, FNP

## 2024-03-20 NOTE — Assessment & Plan Note (Signed)
 Pt advised of the following:  Continue medication as prescribed. Monitor blood pressure periodically and/or when you feel symptomatic. Goal is <130/90 on average. Ensure that you have rested for 30 minutes prior to checking your blood pressure. Record your readings and bring them to your next visit if necessary.work on a low sodium diet. Start amlodipine 5 mg once daily

## 2024-03-20 NOTE — Patient Instructions (Addendum)
  Start monitoring your blood pressure daily, around the same time of day, for the next 2-3 weeks.  Ensure that you have rested for 30 minutes prior to checking your blood pressure. Record your readings and bring them to your next visit.  ------------------------------------  Call pulmonary to schedule follow up with Aaron Williamson for sleep apnea [phone]   ------------------------------------

## 2024-03-25 NOTE — Progress Notes (Signed)
 Aaron Appleman T. Ceria Suminski, MD, CAQ Sports Medicine [facility] at [facility] [address] [place], [address]  Phone: [phone]  FAX: [phone]  Aaron Williamson - 43 y.o. male  MRN [id number]  Date of Birth: December 15, 1980  Date: 03/26/2024  PCP: Aaron Horns, FNP  Referral: Aaron Horns, FNP  Chief Complaint  Patient presents with   Concussion    Post Fall   Subjective:   Aaron Williamson is a 43 y.o. very pleasant male patient with Body mass index is 30.56 kg/m. who presents with the following:  The patient is here for concussion management.  He was seen in the ER on February 27, 2024 for fall and head injury.  He tripped on the sidewalk and hit his head from a standing position.  Did develop a hematoma and abrasion, and also complained initially of some blurry vision and dizziness. He has no prior history of previous concussion.  No intracranial hemorrhage on acute head CT.  He had additional follow-up on March 09, 2024 with Mrs. Dugal at that point he was having some fairly severe headache and some issues with focusing.  He has been placed on intermittent FMLA.  [place], and fell over the tree root and hit his head.  Was working and very dizzy.   Lightheaded and dizzy frequently. 10 hours Mon Thursday. When he is not at work, he does feel a lot better and he is does not get headaches and dizziness generally  He is not having any difficulty driving, using a computer, phone, tablet and he is not having any difficulty with busy backgrounds or busy places.  He does continue to have some headache as well as photophobia and phonophobia. He also continues to have some fatigue and some intermittent dizziness.  Total number of symptoms: 7/22 Symptom severity score 19/132 He believes that he is roughly 75% improved.  Post-concussive headaches He does not have any underlying neurological conditions. Some GAD.  Orientation:  5/5 Immediate memory: 8/15 Digits backwards: 1/4 Months in reverse order 1/1  Review of Systems is noted in the HPI, as appropriate  Objective:   BP 136/84   Pulse 89   Temp 98 F (36.7 C) (Temporal)   Ht 6\' 3"  (1.905 m)   Wt 244 lb 8 oz (110.9 kg)   SpO2 98%   BMI 30.56 kg/m   GEN: No acute distress; alert,appropriate. PULM: Breathing comfortably in no respiratory distress PSYCH: Normally interactive.   Cranial nerves II through XII are grossly intact Sensation and strength are intact Finger-nose normal Near point convergence normal Tandem gait normal M BESS testing is entirely normal  Full motion at the neck  Minimal dizziness with VOMS  Laboratory and Imaging Data:  Assessment and Plan:     ICD-10-CM   1. Post concussive syndrome  F07.81     2. Post-concussion headache  G44.309      I think he is made good progress, but he is 1 month out of initial concussion he continues to have some symptoms.  Predominant symptoms are headache, photophobia, phonophobia and some dizziness, generally brought on when he is working and tired.  While he is not working on the weekend, he is much improved.  I do think he is okay to go to return to the workplace, and he is able to delegate some of his work if needed.  I will have him take a 10-minute break every hour while he is at  work, I think that this will likely help with his persistent symptoms.  If his dizziness is significant, he can also try some meclizine if needed.  I will recheck him in 3 to 4 weeks to make sure he is fully improved  [employer] speech-to-text software was used for transcription in this dictation.  Possible transcriptional errors can occur using Animal nutritionist.   Signed,  Ranny Bye. Montoya Watkin, MD   Outpatient Encounter Medications as of 03/26/2024  Medication Sig   amLODipine  (NORVASC ) 5 MG tablet Take 1 tablet (5 mg total) by mouth daily.   busPIRone  (BUSPAR ) 5 MG tablet Take 1 tablet  (5 mg total) by mouth 2 (two) times daily.   cetirizine  (ZYRTEC ) 10 MG tablet Take 1 tablet (10 mg total) by mouth daily.   fluticasone  (FLONASE ) 50 MCG/ACT nasal spray Place 2 sprays into both nostrils daily.   ibuprofen  (ADVIL ) 600 MG tablet Take 1 tablet (600 mg total) by mouth every 8 (eight) hours as needed.   meloxicam  (MOBIC ) 7.5 MG tablet Take 1 tablet (7.5 mg total) by mouth daily.   No facility-administered encounter medications on file as of 03/26/2024.

## 2024-03-26 ENCOUNTER — Ambulatory Visit: Admitting: Family Medicine

## 2024-03-26 VITALS — BP 136/84 | HR 89 | Temp 98.0°F | Ht 75.0 in | Wt 244.5 lb

## 2024-03-26 DIAGNOSIS — G44309 Post-traumatic headache, unspecified, not intractable: Secondary | ICD-10-CM

## 2024-03-26 DIAGNOSIS — F0781 Postconcussional syndrome: Secondary | ICD-10-CM

## 2024-03-26 NOTE — Patient Instructions (Signed)
Mild Traumatic Brain Injury (CONCUSSSION):  Aaron Dayley, MD, [facility] Adapted from [facility], [facility], and [employer]  Think of the human brain almost as an egg yolk and your skull as an egg shell. When your head or body takes a hit, it can cause your brain to shake around inside your skull, injuring the brain. A concussion is not only caused by a hit to the head, but can also be caused by an impact to the body that ends up shaking your brain in your skull, such as whiplash.  A common misconception about concussion is that one loses consciousness. However, loss of consciousness occurs in only less than 10% of concussion cases.  Concussive symptoms typically resolve in 7 to 10 days (sports-related concussions) or within 3 months (non-athletes).  Approximately 20% of people have symptoms after 6 weeks.  With concussions, we have learned that it generally takes youth longer to recover from concussions. Another thing that we now know about concussions is that it usually takes male athletes longer to recover than male athletes.  No two concussions are identical. In fact, there are at least six different clinical trajectories that concussions may take.  Signs and Symptoms of Concussion:  The signs and symptoms of a concussion are incredibly important because a concussion doesn't show up on imaging like an x-ray, CT, or MRI scan and there is no objective test, like a blood or saliva test, that can determine if a patient has a concussion. A doctor makes a concussion diagnosis based on the results of a comprehensive examination, which includes observing signs of concussion and patients reporting symptoms of concussion appearing after an impact to the head or body. Concussion signs and symptoms are the brain's way of showing it is injured and not functioning normally.  CONCUSSION SIGNS  Concussion signs are what someone could observe about you to  determine if you have a concussion.   Common concussion signs include:  Loss of consciousness Problems with balance Glazed look in the eyes Amnesia Delayed response to questions Forgetting an instruction, confusion about an assignment or position, or  confusion of the game, score, or opponent Inappropriate crying Inappropriate laughter Vomiting  CONCUSSION SYMPTOMS  Concussion symptoms are what someone who is concussed will tell you that they are experiencing. Concussion symptoms typically fall into six major categories:  1- Somatic (Physical) Symptoms  Headache Light-headedness Dizziness Nausea Sensitivity to light Sensitivity to noise  2- Cognitive Symptoms  Difficulties with attention Memory problems Loss of focus Difficulty multitasking Difficulty completing mental tasks  3- Sleep Symptoms  Sleeping more than usual Sleeping less than usual Having trouble falling asleep  4- Emotional Symptoms  Anxiety Depression Panic attacks  5 - Vestibular Symptoms  The vestibular system is affected in nearly 60% of youth and adolescent athletes following a concussion.  But what is the vestibular system?  It's the sensory system that helps with your sense of balance and spatial orientation. Think of a gyroscope! With help from your inner ears, your vestibular system detects the motion or position of your head in space. It sends information to your brain that's needed for balance and stable vision.  Need an example? If you're moving and looking at moving objects at the same time (think riding in a car), you're able to stay focused and not lose visual clarity. During a vestibular concussion, your gyroscope isn't working at full potential.  Difficulty with balance Dizziness. It may feel like the room is spinning   or a slow, wavy sensation. (Like you're on a boat!) Trouble stabilizing vision when moving your head. (We call this Vestibular-Ocular Reflux, or VOR.) Think  back to riding in a car. With a vestibular concussion, you can't stay focused. (The technical name for this is Visual Motion Sensitivity, or VMS.) Triggers  These 3 things could bring on vestibular symptoms:  Dynamic movements Busy environments, like the grocery store Crowds  6 - Oculo-motor  A concussion that affects the ocular, or visual, system of the brain. Typically, patients with ocular-motor concussions report pressure headaches in the front of their head, feeling more tired than normal, and becoming more symptomatic doing math or science exercises at school.  Patients also experience difficulties with their eyes working together. Follow along to explore some of the most common symptoms.  Convergence  The eyes converge when viewing objects up close, such as with reading. With convergence problems, patients may see a double image as a target moves closer to them. Typically, without a concussion, objects can be brought very close without doubling.  Accommodation  Accommodation problems cause an object to become blurry as it is viewed up close. Accommodative and convergence problems are often experienced together and can impact reading and other near-vision activities.  Pursuits and Saccades  The eyes use pursuit eye movements to follow objects; while saccade eyes movements allow the eyes to shift rapidly from one object to another. Tracking objects, reading a book, scrolling on a computer or even watching for a moving car while crossing the street can be difficult when patients have problems with pursuit and saccade eye movements.  Misalignment  When people with eye misalignment (one eye drifts, eyes aren't perfectly aligned) sustain a concussion, the brain may have difficulty compensating for the misalignment like it once did. This can result in blurry vision, difficulty taking notes in class and focusing on the chalkboard.  Note: This is not an exhaustive list of concussion  signs and symptoms, and it may take a few days for concussion symptoms to appear after the initial injury.  TREATMENT:  THE MOST IMPORTANT THING IS REST AS SOON AS POSSIBLE AFTER INJURY SO THAT THE BRAIN CAN RECOVER. COMPLETE PHYSICAL AND MENTAL REST ARE NEEDED INITIALLY.   THAT MEANS: NO SCHOOL OR WORK FOR AT LEAST 3 DAYS AND CLEARED BY YOUR DOCTOR NO MENTAL EXERTION, MEANING NO WORK, NO HOMEWORK, NO TEST TAKING.  Avoid situations with loud noise, bright lights, or crowds. However, this doesn't mean isolate yourself in a dark room for a week. Too much isolation and boredom can be harmful, contributing to feelings of anxiety, depression, and resulting in increased recovery time. Spend time with friends and family, but monitor your symptoms and avoid situations that make you feel worse.   NO VIDEO GAMES, NO USING THE COMPUTER, NO TEXTING, NO USING SMARTPHONES, NO USE OF AN IPAD OR TABLET. DO NOT GO TO A MOVIE THEATRE OR WATCH SPORTS ON TV. HDTV TENDS TO MAKE PEOPLE FEEL WORSE.   ON APPROXIMATELY DAY 4, BEGIN VERY LIGHT EXERCISE SUCH AS WALKING. DO NOT START ANY STRENUOUS EXERCISE.   You will be given return to school or return to work recommendations.  

## 2024-03-28 ENCOUNTER — Encounter: Payer: Self-pay | Admitting: Family Medicine

## 2024-04-15 ENCOUNTER — Encounter: Payer: Self-pay | Admitting: Family

## 2024-04-15 DIAGNOSIS — I10 Essential (primary) hypertension: Secondary | ICD-10-CM

## 2024-04-20 ENCOUNTER — Other Ambulatory Visit (HOSPITAL_COMMUNITY): Payer: Self-pay

## 2024-04-20 MED ORDER — AMLODIPINE BESYLATE 5 MG PO TABS
5.0000 mg | ORAL_TABLET | Freq: Every day | ORAL | 3 refills | Status: AC
  Filled 2024-04-20: qty 90, 90d supply, fill #0
  Filled 2024-05-20: qty 30, 30d supply, fill #0
  Filled 2024-06-22: qty 30, 30d supply, fill #1
  Filled 2024-07-22: qty 30, 30d supply, fill #2
  Filled 2024-08-26: qty 30, 30d supply, fill #3
  Filled 2024-09-28: qty 30, 30d supply, fill #4
  Filled 2024-10-30: qty 30, 30d supply, fill #5
  Filled 2024-12-04: qty 30, 30d supply, fill #6

## 2024-04-22 NOTE — Progress Notes (Deleted)
     Aaron Williamson T. Carlyn Lemke, MD, [facility] [address] [place], [address]  Phone: [phone]  FAX: [phone]  Aaron Williamson - 43 y.o. male  MRN [id number]  Date of Birth: [birth year]  Date: 04/23/2024  PCP: Felicita Horns, FNP  Referral: Felicita Horns, FNP  No chief complaint on file.  Subjective:   Aaron Williamson is a 43 y.o. very pleasant male patient with There is no height or weight on file to calculate BMI. who presents with the following:  This is a pleasant gentleman who I recall well.  He is here for 4-week follow-up postconcussive syndrome.  The last time I saw him he was having some photophobia, phonophobia, dizziness and headache. At that time, he felt like his symptoms were improving quite a bit.  I did allow him to return to work, but taking breaks every hour to help with recovery.    Review of Systems is noted in the HPI, as appropriate  Objective:   There were no vitals taken for this visit.  GEN: No acute distress; alert,appropriate. PULM: Breathing comfortably in no respiratory distress PSYCH: Normally interactive.   Laboratory and Imaging Data:  Assessment and Plan:   ***

## 2024-04-23 ENCOUNTER — Ambulatory Visit: Admitting: Family Medicine

## 2024-05-07 ENCOUNTER — Ambulatory Visit: Admitting: Family Medicine

## 2024-05-07 ENCOUNTER — Encounter: Payer: Self-pay | Admitting: Family Medicine

## 2024-05-07 VITALS — BP 116/70 | HR 83 | Temp 98.0°F | Ht 75.0 in | Wt 244.0 lb

## 2024-05-07 DIAGNOSIS — F0781 Postconcussional syndrome: Secondary | ICD-10-CM | POA: Diagnosis not present

## 2024-05-07 DIAGNOSIS — Z09 Encounter for follow-up examination after completed treatment for conditions other than malignant neoplasm: Secondary | ICD-10-CM | POA: Diagnosis not present

## 2024-05-07 NOTE — Progress Notes (Signed)
     Aaron Coletti T. [name], MD, [facility] Sports [facility] [address] [place], [address]  Phone: [phone]  FAX: [phone]  Aaron Williamson - 43 y.o. male  MRN [id number]  Date of Birth: 1981-06-09  Date: 05/07/2024  PCP: Corwin Antu, FNP  Referral: Corwin Antu, FNP  Chief Complaint  Patient presents with   Concussion    Follow up    Subjective:   Aaron Williamson is a 43 y.o. very pleasant male patient with Body mass index is 30.5 kg/m. who presents with the following:  This is a pleasant gentleman who I recall well.  He is here for 4-week follow-up postconcussive syndrome.  The last time I saw him he was having some photophobia, phonophobia, dizziness and headache. At that time, he felt like his symptoms were improving quite a bit.  I did allow him to return to work, but taking breaks every hour to help with recovery.  Now fully recovered and no symptoms He feels totally great Some fatigue right now this week, working 100 degree weather.  No visual issues.  He has been able to play video games for hours at a time without any induction of symptoms.  Review of Systems is noted in the HPI, as appropriate  Objective:   BP 116/70   Pulse 83   Temp 98 F (36.7 C) (Temporal)   Ht 6' 3 (1.905 m)   Wt 244 lb (110.7 kg)   SpO2 96%   BMI 30.50 kg/m   GEN: No acute distress; alert,appropriate. PULM: Breathing comfortably in no respiratory distress PSYCH: Normally interactive.   Cranial nerves II through XII intact Pupils equal round reactive to light and accommodation, extraocular movements intact Voms normal Mbess normal Heel toe walking normal  Laboratory and Imaging Data:  Assessment and Plan:     ICD-10-CM   1. Post concussive syndrome  F07.81      All symptoms resolved.  I have written him a letter to allow him to return to full work without restrictions.  He can follow-up with me as needed  only.  Disposition: No follow-ups on file.  [employer] speech-to-text software was used for transcription in this dictation.  Possible transcriptional errors can occur using Animal nutritionist.   Signed,  Jacques DASEN. Kathryn Cosby, MD   Outpatient Encounter Medications as of 05/07/2024  Medication Sig   amLODipine  (NORVASC ) 5 MG tablet Take 1 tablet (5 mg total) by mouth daily.   busPIRone  (BUSPAR ) 5 MG tablet Take 1 tablet (5 mg total) by mouth 2 (two) times daily.   cetirizine  (ZYRTEC ) 10 MG tablet Take 1 tablet (10 mg total) by mouth daily.   fluticasone  (FLONASE ) 50 MCG/ACT nasal spray Place 2 sprays into both nostrils daily.   ibuprofen  (ADVIL ) 600 MG tablet Take 1 tablet (600 mg total) by mouth every 8 (eight) hours as needed.   meloxicam  (MOBIC ) 7.5 MG tablet Take 1 tablet (7.5 mg total) by mouth daily.   No facility-administered encounter medications on file as of 05/07/2024.

## 2024-05-08 ENCOUNTER — Encounter: Payer: Self-pay | Admitting: Family Medicine

## 2024-05-20 ENCOUNTER — Other Ambulatory Visit (HOSPITAL_COMMUNITY): Payer: Self-pay

## 2024-06-22 ENCOUNTER — Other Ambulatory Visit (HOSPITAL_COMMUNITY): Payer: Self-pay

## 2024-07-20 ENCOUNTER — Other Ambulatory Visit: Payer: Self-pay

## 2024-07-22 ENCOUNTER — Other Ambulatory Visit (HOSPITAL_COMMUNITY): Payer: Self-pay

## 2024-08-07 ENCOUNTER — Other Ambulatory Visit: Payer: Self-pay

## 2024-08-12 ENCOUNTER — Encounter: Payer: Self-pay | Admitting: Family

## 2024-08-28 ENCOUNTER — Other Ambulatory Visit (HOSPITAL_COMMUNITY): Payer: Self-pay

## 2024-09-28 ENCOUNTER — Other Ambulatory Visit (HOSPITAL_COMMUNITY): Payer: Self-pay

## 2024-12-04 ENCOUNTER — Other Ambulatory Visit (HOSPITAL_COMMUNITY): Payer: Self-pay
